# Patient Record
Sex: Female | Born: 1980 | Race: White | Hispanic: No | Marital: Married | Smoking: Never smoker
Health system: Southern US, Community
[De-identification: ages and names within clinical notes are randomized; demographics above are authoritative.]

## PROBLEM LIST (undated history)

## (undated) DIAGNOSIS — IMO0002 Reserved for concepts with insufficient information to code with codable children: Secondary | ICD-10-CM

## (undated) DIAGNOSIS — F32A Depression, unspecified: Secondary | ICD-10-CM

## (undated) DIAGNOSIS — K219 Gastro-esophageal reflux disease without esophagitis: Secondary | ICD-10-CM

## (undated) DIAGNOSIS — R87619 Unspecified abnormal cytological findings in specimens from cervix uteri: Secondary | ICD-10-CM

## (undated) DIAGNOSIS — R87629 Unspecified abnormal cytological findings in specimens from vagina: Secondary | ICD-10-CM

## (undated) DIAGNOSIS — F419 Anxiety disorder, unspecified: Secondary | ICD-10-CM

## (undated) DIAGNOSIS — O009 Unspecified ectopic pregnancy without intrauterine pregnancy: Secondary | ICD-10-CM

## (undated) DIAGNOSIS — K589 Irritable bowel syndrome without diarrhea: Secondary | ICD-10-CM

## (undated) DIAGNOSIS — S22080A Wedge compression fracture of T11-T12 vertebra, initial encounter for closed fracture: Secondary | ICD-10-CM

## (undated) DIAGNOSIS — R519 Headache, unspecified: Secondary | ICD-10-CM

## (undated) DIAGNOSIS — F329 Major depressive disorder, single episode, unspecified: Secondary | ICD-10-CM

## (undated) DIAGNOSIS — O020 Blighted ovum and nonhydatidiform mole: Secondary | ICD-10-CM

## (undated) HISTORY — DX: Blighted ovum and nonhydatidiform mole: O02.0

## (undated) HISTORY — DX: Anxiety disorder, unspecified: F41.9

## (undated) HISTORY — PX: INTRAUTERINE DEVICE (IUD) INSERTION: SHX5877

## (undated) HISTORY — PX: DILATION AND CURETTAGE OF UTERUS: SHX78

---
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
## 2002-04-30 ENCOUNTER — Ambulatory Visit (HOSPITAL_COMMUNITY): Admission: RE | Admit: 2002-04-30 | Discharge: 2002-04-30 | Payer: Self-pay | Admitting: Gastroenterology

## 2002-05-17 ENCOUNTER — Encounter: Payer: Self-pay | Admitting: Gastroenterology

## 2002-05-17 ENCOUNTER — Encounter: Admission: RE | Admit: 2002-05-17 | Discharge: 2002-05-17 | Payer: Self-pay | Admitting: Gastroenterology

## 2002-12-30 HISTORY — PX: WISDOM TOOTH EXTRACTION: SHX21

## 2003-11-04 ENCOUNTER — Other Ambulatory Visit: Admission: RE | Admit: 2003-11-04 | Discharge: 2003-11-04 | Payer: Self-pay | Admitting: Obstetrics & Gynecology

## 2003-12-15 ENCOUNTER — Other Ambulatory Visit: Admission: RE | Admit: 2003-12-15 | Discharge: 2003-12-15 | Payer: Self-pay | Admitting: Obstetrics & Gynecology

## 2003-12-31 HISTORY — PX: COLPOSCOPY: SHX161

## 2005-06-22 ENCOUNTER — Emergency Department (HOSPITAL_COMMUNITY): Admission: EM | Admit: 2005-06-22 | Discharge: 2005-06-22 | Payer: Self-pay | Admitting: Emergency Medicine

## 2005-07-11 ENCOUNTER — Emergency Department (HOSPITAL_COMMUNITY): Admission: EM | Admit: 2005-07-11 | Discharge: 2005-07-11 | Payer: Self-pay | Admitting: Emergency Medicine

## 2006-02-01 ENCOUNTER — Emergency Department (HOSPITAL_COMMUNITY): Admission: EM | Admit: 2006-02-01 | Discharge: 2006-02-01 | Payer: Self-pay | Admitting: Emergency Medicine

## 2006-05-23 ENCOUNTER — Emergency Department: Payer: Self-pay | Admitting: Emergency Medicine

## 2007-03-26 ENCOUNTER — Emergency Department (HOSPITAL_COMMUNITY): Admission: EM | Admit: 2007-03-26 | Discharge: 2007-03-26 | Payer: Self-pay | Admitting: Emergency Medicine

## 2007-04-06 ENCOUNTER — Encounter: Admission: RE | Admit: 2007-04-06 | Discharge: 2007-04-06 | Payer: Self-pay | Admitting: Gastroenterology

## 2008-11-16 ENCOUNTER — Emergency Department: Payer: Self-pay | Admitting: Emergency Medicine

## 2009-12-30 DIAGNOSIS — O009 Unspecified ectopic pregnancy without intrauterine pregnancy: Secondary | ICD-10-CM

## 2009-12-30 HISTORY — DX: Unspecified ectopic pregnancy without intrauterine pregnancy: O00.90

## 2010-03-21 ENCOUNTER — Inpatient Hospital Stay (HOSPITAL_COMMUNITY): Admission: AD | Admit: 2010-03-21 | Discharge: 2010-03-21 | Payer: Self-pay | Admitting: Obstetrics and Gynecology

## 2010-12-04 ENCOUNTER — Ambulatory Visit (HOSPITAL_COMMUNITY)
Admission: RE | Admit: 2010-12-04 | Discharge: 2010-12-04 | Payer: Self-pay | Source: Home / Self Care | Admitting: Obstetrics & Gynecology

## 2011-03-25 LAB — HCG, QUANTITATIVE, PREGNANCY: hCG, Beta Chain, Quant, S: 106 m[IU]/mL — ABNORMAL HIGH (ref <5)

## 2011-03-25 LAB — CBC
HCT: 38.9 % (ref 36.0–46.0)
Hemoglobin: 13.4 g/dL (ref 12.0–15.0)
MCHC: 34.3 g/dL (ref 30.0–36.0)
MCV: 92.6 fL (ref 78.0–100.0)
Platelets: 283 10*3/uL (ref 150–400)
RBC: 4.2 MIL/uL (ref 3.87–5.11)
RDW: 12.7 % (ref 11.5–15.5)
WBC: 8.6 10*3/uL (ref 4.0–10.5)

## 2011-03-25 LAB — ABO/RH: ABO/RH(D): O NEG

## 2011-05-17 NOTE — Procedures (Signed)
. [facility]  Patient:    Erica Estes, Erica Estes Visit Number: [id number] MRN: [id number]          Service Type: END Location: ENDO Attending Physician:  Orland Mustard Dictated by:   Llana Aliment. Randa Evens, M.D. Proc. Date: 04/30/02 Admit Date:  04/30/2002 Discharge Date: 04/30/2002   CC:         [employer] student [employer] service   Procedure Report  DATE OF BIRTH:  [birth year]  PROCEDURE PERFORMED:  Esophagogastroduodenscopy with biopsies.  ENDOSCOPIST:  Llana Aliment. Randa Evens, M.D.  MEDICATIONS:  Cetacaine spray, fentanyl 100 mcg, Versed 10 mg IV.  INDICATIONS:  Upper abdominal pain and nausea refractory to Nexium.  DESCRIPTION OF PROCEDURE:  The procedure had been explained to the patient and consent obtained.  With the patient in the left lateral decubitus position, the Olympus video endoscope was inserted and advanced under direct visualization.  The stomach was entered and pylorus identified and passed. The duodenum including the bulb and second portion were unremarkable.  The scope was withdrawn back into the stomach.  The pyloric channel was normal. Antrum and body were normal.  Fundus and cardia were seen well on the retroflex view and were normal.  The scope was withdrawn.  There was a hiatal hernia and the distal and proximal esophagus were normal.  The patient tolerated the procedure quite well, was maintained on low-flow oxygen and pulse oximeter throughout the procedure. ASSESSMENT:  Small hiatal hernia and possible mild esophageal reflux disease. No other abnormalities.  PLAN:  Will try Robinul.  Will set up ultrasound of her gallbladder next week. Office visit three to four weeks. Dictated by:   Llana Aliment. Randa Evens, M.D. Attending Physician:  Orland Mustard DD:  04/30/02 TD:  05/03/02 Job: [id number]

## 2011-05-17 NOTE — Procedures (Signed)
[facility]  Patient:    Erica Estes, Erica Estes                        MRN: [id number] Proc. Date: 04/29/01 Adm. Date:  [id number] Attending:  Orland Mustard CC:         Caleen Essex. Peter Congo, M.D.   Procedure Report  PROCEDURE:  Esophagogastroduodenoscopy.  GASTROENTEROLOGIST:  Llana Aliment. Randa Evens, M.D.  MEDICATIONS:  Hurricane spray, fentanyl 50 mcg, Versed 7 mg IV.  INDICATIONS:  Persistent abdominal pain despite negative gallbladder ultrasound and treatment with Protonix.  DESCRIPTION OF PROCEDURE:  The procedure had been explained to the patient and consent obtained.  With the patient in the left lateral decubitus position, the Olympus video endoscope was inserted blindly into the esophagus and advanced under direct visualization.  The stomach was entered, pylorus identified and passed.  The duodenum including the bulb and second portion were seen and were unremarkable.  The scope was drawn back in the stomach. The antrum and body were seen well and were normal.  The fundus and cardia were seen well and were normal.  The GE junction was widely patent, and there appeared to be free reflux and marked redness of the esophagus.  The proximal esophagus was normal.  The scope was withdrawn.   The patient tolerated the procedure well.  ASSESSMENT:  Probable esophageal reflux.  PLAN:  Will give her double dose Protonix, reflux instructions, see back in the office in three weeks.   We may need to consider 24-hour pH probe if not improved. DD:  04/29/01 TD:  04/30/01 Job: [id number]

## 2011-05-17 NOTE — Op Note (Signed)
[facility]  Patient:    Erica Estes                       MRN: [id number] Proc. Date: 12/03/99 Adm. Date:  [id number] Attending:  Genia Del                           Operative Report  PREOPERATIVE DIAGNOSIS:       Right persistent pelvic pain.  POSTOPERATIVE DIAGNOSIS:      Right persistent pelvic pain.  Plus sigmoid adhesions and possible minimal endometriosis.  OPERATION:                    Diagnostic laparoscopy with lysis of adhesions and biopsy of the posterior cul-de-sac peritoneum.  SURGEON:                      Genia Del, M.D.  ASSISTANT:  ANESTHESIA:                   Vita Erm, Montez Hageman., M.D.  ESTIMATED BLOOD LOSS:  DESCRIPTION OF PROCEDURE:     Under general anesthesia with endotracheal intubation, the patient is in the lithotomy position.  She is prepped with Betadine on the abdominal, suprapubic, vulvar, and vaginal areas.  The bladder is emptied with a catheter and the patient is draped as usual.  The pelvic examination under general anesthesia reveals an anterior uterus, mobile.  The adnexa are normal. The speculum is inserted.  The uterus is cannulated with a clamp.  The cervix is grasped with the same clamp.  The speculum is removed.  Abdominally an incision is done under the umbilicus over 10 mm with the scalpel.  The Veress needle is inserted while raising the abdominal wall.  The security tests are done and optimal peritoneum is created with 3 liters of CO2.  When done, the Veress needle was removed.  The trocar was inserted and then the camera is introduced.  The exploration of the pelvic cavity reveals a small anteverted uterus, very mobile. The two ovaries are completely normal and mobile.  The tubes are showing normal  pavillions and normal fimbriae and are mobile, but they are elongated, bringing the ovary in the distal part of the tube as abdominal organs.  The  anterior cul-de-sac is completely free of disease.  The posterior cul-de-sac reveals very superficial noncharacteristic lesions that could be endometriosis.  This is mainly on the left uterosacral ligament and between the two uterosacral ligaments.  The ureters are visualized on each side and are in normal anatomy position.  On the left side, there is 10 adhesions between the sigmoid and the parietoperitoneum.  There is ne small adhesion also in the ovarian fossa between the peritoneum and the sigmoid.  A contraincision is done in the suprapubic area with the scalpel at 5 mm.  The 5 m trocar is inserted and scissors are introduced.  A 5 mm incision is also done on the left iliac fossa and the 5 mm trocar is inserted with a bipolar clamp.  The  adhesion of the sigmoid are relieved as much as possible to free the left tube nd ovary on that side and to relieve the tension on the sigmoid.  Then a biopsy is  taken of the superficial possible endometriosis in the posterior cul-de-sac and it is sent to pathology.  Hemostasis is good.  Exploration of  the abdominal cavity is done revealing a normal liver, normal gallbladder, and normal appendix.  No adhesion is present in this area.  The estimated blood loss was minimal. The instruments are removed under direct vision.  The CO2 is evacuated.  The instrument is removed at the level of the vagina as well and the skin is closed with Monocryl 4-0 at the three incisions.  The aponeurosis is not closed at the level of the umbilicus because the incision is too small.  Marcaine 0.25% is infiltrated in he subcutaneous tissue around all incisions.  No complications occurred and the patient was brought to the recovery room in good status. DD:  12/03/99 TD:  12/03/99 Job: [id number]

## 2011-12-31 NOTE — L&D Delivery Note (Signed)
Delivery Note  First Stage: Labor onset: 0100 (12/30) Augmentation : pitocin Analgesia Eliezer Lofts intrapartum: epidural ROM -spontaneous at 0100 (12/30)  Second Stage: Complete dilation at 0016 Onset of pushing at 0030 FHR second stage 140 - category 1  Delivery of a viable female at 62 by CNM in ROA position.  Nuchal cord x 1 reduced over head at birth. Cord double clamped after cessation of pulsation, cut by FOB.  Cord blood sample collected.   Third Stage: Placenta delivered shultz intact with 3 VC.  Placenta - 0110. Uterine tone firm/ bleeding small  2nd degree laceration identified.  Anesthesia for repair: epidural and local 1% xylocaine Repair 3-0 vicryl / 4-0 vicryl subcuticular Est. Blood Loss (mL):  Complications: none  Mom to postpartum.  Baby to Mom.  Newborn: Birth Weight: 6-15 Apgar Scores: 9-9 Feeding planned: breast  Marlinda Mike CNM, MSN 12/29/2012, 1:24 AM

## 2012-12-26 ENCOUNTER — Encounter (HOSPITAL_COMMUNITY): Payer: Self-pay | Admitting: *Deleted

## 2012-12-26 ENCOUNTER — Inpatient Hospital Stay (HOSPITAL_COMMUNITY)
Admission: AD | Admit: 2012-12-26 | Discharge: 2012-12-26 | Disposition: A | Discharge status: Home / Self Care | Payer: 59 | Source: Ambulatory Visit | Attending: Obstetrics & Gynecology | Admitting: Obstetrics & Gynecology

## 2012-12-26 DIAGNOSIS — O99891 Other specified diseases and conditions complicating pregnancy: Secondary | ICD-10-CM | POA: Insufficient documentation

## 2012-12-26 HISTORY — DX: Irritable bowel syndrome, unspecified: K58.9

## 2012-12-26 HISTORY — DX: Gastro-esophageal reflux disease without esophagitis: K21.9

## 2012-12-26 HISTORY — DX: Unspecified abnormal cytological findings in specimens from cervix uteri: R87.619

## 2012-12-26 HISTORY — DX: Wedge compression fracture of t11-T12 vertebra, initial encounter for closed fracture: S22.080A

## 2012-12-26 HISTORY — DX: Reserved for concepts with insufficient information to code with codable children: IMO0002

## 2012-12-26 HISTORY — DX: Depression, unspecified: F32.A

## 2012-12-26 HISTORY — DX: Unspecified ectopic pregnancy without intrauterine pregnancy: O00.90

## 2012-12-26 HISTORY — DX: Major depressive disorder, single episode, unspecified: F32.9

## 2012-12-26 LAB — AMNISURE RUPTURE OF MEMBRANE (ROM) NOT AT ARMC: Amnisure ROM: NEGATIVE

## 2012-12-26 NOTE — MAU Note (Signed)
"  I noticed leaking about 1100 yesterday.   I have been wearing a pantiliner and it was completely soaked yesterday evening.  This morning it was still leaking at that same rate and it made me nervous, so I called the nurse line.  She told me to come in.  I did notice a little spotting yesterday x1.  Then I noticed it again when I went to the BR here that I saw another spot of blood.  (+) FM."

## 2012-12-26 NOTE — MAU Provider Note (Signed)
  History   Patient has presented with history of leakage of fluid for past 24 hrs. Was not completely sure at first and wore a pad over night that was soaked. Called via the Call Service and advised to come to MAU for evaluation of possible ROM.GA. [redacted]w[redacted]d G2P0010. Uncomplicated pregnancy. GBS: neg.  CSN: [id number]  Arrival date and time: 12/26/12 1035   None     Chief Complaint  Patient presents with  . Rupture of Membranes   HPI  G2P0010. Ectopic Pregnancy 2011  History reviewed. No pertinent past medical history.  History reviewed. No pertinent past surgical history.  No family history on file.  History  Substance Use Topics  . Smoking status: Not on file  . Smokeless tobacco: Not on file  . Alcohol Use: Not on file    Allergies: Allergies not on file  No prescriptions prior to admission    Review of Systems  Constitutional: Negative.   HENT: Negative.   Eyes: Negative.   Respiratory: Negative.   Cardiovascular: Negative.   Gastrointestinal: Negative.   Genitourinary: Negative.        R/o possible rupture of membranes  Musculoskeletal: Negative.   Skin: Negative.   Neurological: Negative.   Psychiatric/Behavioral: Negative.    Physical Exam   Blood pressure 139/83, pulse 98, temperature 98 F (36.7 C), temperature source Oral, resp. rate 18, height 5\' 7"  (1.702 m), weight 82.283 kg (181 lb 6.4 oz).  Physical Exam  Constitutional: She is oriented to person, place, and time. She appears well-developed and well-nourished.  HENT:  Head: Normocephalic.  Eyes: Conjunctivae normal and EOM are normal. Pupils are equal, round, and reactive to light.  Neck: Normal range of motion. Neck supple.  Cardiovascular: Normal rate, regular rhythm and normal heart sounds.   Respiratory: Effort normal and breath sounds normal.  GI: Soft. Bowel sounds are normal.  Genitourinary: Vagina normal and uterus normal.       Possible SROM - for evaluation   Musculoskeletal:  Normal range of motion.  Neurological: She is alert and oriented to person, place, and time. She has normal reflexes.  Skin: Skin is warm and dry.  Psychiatric: She has a normal mood and affect.    MAU Course  Procedures  MDM Patient had Amnio- sure. Speculum Examination: Vault visualized - mucus but evidence of Pooling.  SVE: 1/80%/-2, posterior, soft. EFM: FHT's: 150 bpm baseline - Cat 1 tracing. No contractions  Assessment and Plan  Patient presented for evaluation of possible SROM at [redacted]w[redacted]d, G2P0010. Amni-sure: Negative D/c to home. F/U at office this weeks as scheduled.  Rolly Magri, CNM. 12/26/2012, 11:15 AM

## 2012-12-26 NOTE — MAU Note (Signed)
Pt reports having increased wetness since yesterday. Having mild contractions off and on since yesterday as well. Good fetal movement reported.

## 2012-12-28 ENCOUNTER — Inpatient Hospital Stay (HOSPITAL_COMMUNITY): Payer: 59 | Admitting: Anesthesiology

## 2012-12-28 ENCOUNTER — Encounter (HOSPITAL_COMMUNITY): Payer: Self-pay | Admitting: Anesthesiology

## 2012-12-28 ENCOUNTER — Encounter (HOSPITAL_COMMUNITY): Payer: Self-pay | Admitting: *Deleted

## 2012-12-28 ENCOUNTER — Inpatient Hospital Stay (HOSPITAL_COMMUNITY)
Admission: AD | Admit: 2012-12-28 | Discharge: 2012-12-31 | DRG: 775 | Disposition: A | Discharge status: Home / Self Care | Payer: 59 | Source: Ambulatory Visit | Attending: Obstetrics | Admitting: Obstetrics

## 2012-12-28 LAB — OB RESULTS CONSOLE RUBELLA ANTIBODY, IGM: Rubella: UNDETERMINED

## 2012-12-28 LAB — OB RESULTS CONSOLE GBS: GBS: NEGATIVE

## 2012-12-28 LAB — OB RESULTS CONSOLE RPR
RPR: NONREACTIVE
RPR: NONREACTIVE
RPR: NONREACTIVE

## 2012-12-28 LAB — CBC
HCT: 37.8 % (ref 36.0–46.0)
HCT: 36.3 % (ref 36.0–46.0)
Hemoglobin: 12.8 g/dL (ref 12.0–15.0)
Hemoglobin: 12.4 g/dL (ref 12.0–15.0)
MCH: 30.2 pg (ref 26.0–34.0)
MCH: 30.2 pg (ref 26.0–34.0)
MCHC: 33.9 g/dL (ref 30.0–36.0)
MCHC: 34.2 g/dL (ref 30.0–36.0)
MCV: 89.2 fL (ref 78.0–100.0)
MCV: 88.5 fL (ref 78.0–100.0)
Platelets: 201 10*3/uL (ref 150–400)
Platelets: 176 10*3/uL (ref 150–400)
RBC: 4.24 MIL/uL (ref 3.87–5.11)
RBC: 4.1 MIL/uL (ref 3.87–5.11)
RDW: 12.8 % (ref 11.5–15.5)
RDW: 12.6 % (ref 11.5–15.5)
WBC: 14.2 10*3/uL — ABNORMAL HIGH (ref 4.0–10.5)
WBC: 19.4 10*3/uL — ABNORMAL HIGH (ref 4.0–10.5)

## 2012-12-28 LAB — RPR: RPR Ser Ql: NONREACTIVE

## 2012-12-28 LAB — OB RESULTS CONSOLE GC/CHLAMYDIA
Chlamydia: NEGATIVE
Gonorrhea: NEGATIVE

## 2012-12-28 LAB — OB RESULTS CONSOLE ANTIBODY SCREEN: Antibody Screen: NEGATIVE

## 2012-12-28 LAB — OB RESULTS CONSOLE HEPATITIS B SURFACE ANTIGEN: Hepatitis B Surface Ag: NEGATIVE

## 2012-12-28 LAB — OB RESULTS CONSOLE HIV ANTIBODY (ROUTINE TESTING): HIV: NONREACTIVE

## 2012-12-28 MED ORDER — OXYTOCIN 40 UNITS IN LACTATED RINGERS INFUSION - SIMPLE MED: 62.5000 mL/h | INTRAVENOUS | Status: DC

## 2012-12-28 MED ORDER — PROMETHAZINE HCL 25 MG/ML IJ SOLN
12.5000 mg | Freq: Four times a day (QID) | INTRAMUSCULAR | Status: DC | PRN
  Administered 2012-12-28: 12.5 mg via INTRAVENOUS
  Filled 2012-12-28: qty 1

## 2012-12-28 MED ORDER — IBUPROFEN 600 MG PO TABS
600.0000 mg | ORAL_TABLET | Freq: Four times a day (QID) | ORAL | Status: DC | PRN
  Administered 2012-12-29: 600 mg via ORAL
  Filled 2012-12-28: qty 1

## 2012-12-28 MED ORDER — CITRIC ACID-SODIUM CITRATE 334-500 MG/5ML PO SOLN
30.0000 mL | ORAL | Status: DC | PRN
  Administered 2012-12-28: 30 mL via ORAL
  Filled 2012-12-28: qty 15

## 2012-12-28 MED ORDER — PHENYLEPHRINE 40 MCG/ML (10ML) SYRINGE FOR IV PUSH (FOR BLOOD PRESSURE SUPPORT)
80.0000 ug | PREFILLED_SYRINGE | INTRAVENOUS | Status: DC | PRN
  Filled 2012-12-28: qty 5

## 2012-12-28 MED ORDER — PHENYLEPHRINE 40 MCG/ML (10ML) SYRINGE FOR IV PUSH (FOR BLOOD PRESSURE SUPPORT): 80.0000 ug | PREFILLED_SYRINGE | INTRAVENOUS | Status: DC | PRN

## 2012-12-28 MED ORDER — ACETAMINOPHEN 325 MG PO TABS: 650.0000 mg | ORAL_TABLET | ORAL | Status: DC | PRN

## 2012-12-28 MED ORDER — OXYTOCIN BOLUS FROM INFUSION
500.0000 mL | INTRAVENOUS | Status: DC
  Administered 2012-12-29: 500 mL via INTRAVENOUS

## 2012-12-28 MED ORDER — ONDANSETRON HCL 4 MG/2ML IJ SOLN
4.0000 mg | Freq: Four times a day (QID) | INTRAMUSCULAR | Status: DC | PRN
  Administered 2012-12-28: 4 mg via INTRAVENOUS
  Filled 2012-12-28: qty 2

## 2012-12-28 MED ORDER — OXYTOCIN 40 UNITS IN LACTATED RINGERS INFUSION - SIMPLE MED
1.0000 m[IU]/min | INTRAVENOUS | Status: DC
  Administered 2012-12-28: 2 m[IU]/min via INTRAVENOUS
  Filled 2012-12-28: qty 1000

## 2012-12-28 MED ORDER — LIDOCAINE HCL (PF) 1 % IJ SOLN
INTRAMUSCULAR | Status: DC | PRN
  Administered 2012-12-28 (×2): 5 mL
  Administered 2012-12-29: 30 mL

## 2012-12-28 MED ORDER — LACTATED RINGERS IV SOLN
INTRAVENOUS | Status: DC
  Administered 2012-12-28: 19:00:00 via INTRAVENOUS

## 2012-12-28 MED ORDER — NALBUPHINE SYRINGE 5 MG/0.5 ML
10.0000 mg | INJECTION | INTRAMUSCULAR | Status: DC | PRN
  Administered 2012-12-28: 10 mg via INTRAVENOUS
  Filled 2012-12-28 (×2): qty 1

## 2012-12-28 MED ORDER — LACTATED RINGERS IV SOLN: 500.0000 mL | INTRAVENOUS | Status: DC | PRN

## 2012-12-28 MED ORDER — EPHEDRINE 5 MG/ML INJ
10.0000 mg | INTRAVENOUS | Status: DC | PRN
  Administered 2012-12-29: 10 mg via INTRAVENOUS
  Filled 2012-12-28: qty 4

## 2012-12-28 MED ORDER — LACTATED RINGERS IV BOLUS (SEPSIS)
500.0000 mL | Freq: Once | INTRAVENOUS | Status: AC
  Administered 2012-12-28: 500 mL via INTRAVENOUS

## 2012-12-28 MED ORDER — LACTATED RINGERS IV SOLN
500.0000 mL | Freq: Once | INTRAVENOUS | Status: AC
  Administered 2012-12-28: 500 mL via INTRAVENOUS

## 2012-12-28 MED ORDER — EPHEDRINE 5 MG/ML INJ
10.0000 mg | INTRAVENOUS | Status: AC | PRN
  Administered 2012-12-28 (×2): 10 mg via INTRAVENOUS
  Filled 2012-12-28: qty 4

## 2012-12-28 MED ORDER — OXYCODONE-ACETAMINOPHEN 5-325 MG PO TABS: 1.0000 | ORAL_TABLET | ORAL | Status: DC | PRN

## 2012-12-28 MED ORDER — DIPHENHYDRAMINE HCL 50 MG/ML IJ SOLN: 12.5000 mg | INTRAMUSCULAR | Status: DC | PRN

## 2012-12-28 MED ORDER — LIDOCAINE HCL (PF) 1 % IJ SOLN
30.0000 mL | INTRAMUSCULAR | Status: DC | PRN
  Filled 2012-12-28: qty 30

## 2012-12-28 MED ORDER — FENTANYL 2.5 MCG/ML BUPIVACAINE 1/10 % EPIDURAL INFUSION (WH - ANES)
14.0000 mL/h | INTRAMUSCULAR | Status: DC
  Administered 2012-12-28: 14 mL/h via EPIDURAL
  Filled 2012-12-28: qty 125

## 2012-12-28 NOTE — Progress Notes (Signed)
S:  Moaning and crying with ctx - wants epidural  O:  VS: Blood pressure 142/88, pulse 74, temperature 98.4 F (36.9 C), temperature source Oral, resp. rate 20, height 5\' 8"  (1.727 m), weight 82.101 kg (181 lb).        FHR : baseline 145 / variability moderate / accels + / decels none        Toco: contractions every 2-3 minutes / pitocin 12 mu/min         Cervix : 5-6 / 90% / vtx / -1        Membranes: +show  A: active labor     FHR category 1  P: place epidural    Marlinda Mike CNM, MSN 12/28/2012, 11:03 PM

## 2012-12-28 NOTE — Progress Notes (Signed)
S:  Ctx stronger  O:  VS: Blood pressure 132/87, pulse 85, temperature 98.6 F (37 C), temperature source Oral, resp. rate 20, height 5\' 8"  (1.727 m), weight 82.101 kg (181 lb).        FHR : baseline 145 / variability moderate / accels + / decels none        Toco: contractions every 2-5 minutes / mild / MVU 80-90 - IUPC        Cervix : unchanged        Membranes: clear - small show  A: protracted latent labor     FHR category 1  P: recommend pitocin augmentation - agrees by patient      Water immersion for pain management       Remove IUPC prior to water immersion     Marlinda Mike CNM, MSN 12/28/2012, 6:03 PM

## 2012-12-28 NOTE — H&P (Signed)
Erica Estes is a 31 y.o. female, G2P0010 at [redacted]w[redacted]d , presenting for labor management with confirmed SROM at 01.00 hrs 12/28/12. Hx of Ectopic 2011. GBS neg. Uncomplicated pregnancy. Blood type O neg: Rhogam  In 1st Trimester and at 28 weeks. Taking Wellbutrin for depression at time of admission.  There is no problem list on file for this patient.   History of present pregnancy: Patient entered care at [redacted]w[redacted]d weeks.   EDC of  was established by LMP and confirmed with sono.   Anatomy scan:  [redacted]w[redacted]d weeks, with normal findings and an anterior placenta.   Additional [place] evaluations:  .   Significant prenatal events:  Spotting early pregnancy and Rhogam given in 1st Trimester due to [facility] neg status   Last evaluation: MAU 12/28/12 prior to admission  OB History    Grav Para Term Preterm Abortions TAB SAB Ect Mult Living   2    1   1   0     Past Medical History  Diagnosis Date  . GERD (gastroesophageal reflux disease)   . IBS (irritable bowel syndrome)   . Abnormal Pap smear   . Compression fracture of T12 vertebra 2000  . Ectopic pregnancy 2011    no tx needed, "It absorbed itself"  . Depression     Wellbutrin 150 mg daily   Past Surgical History  Procedure Date  . Laparoscopy 2000  . Tonsillectomy 12/98  . Colposcopy 2005   Family History: family history includes Bipolar disorder in her maternal aunt; Cancer (age of onset:40) in her father; Depression in her maternal aunt; Heart disease in her paternal grandfather; Hyperlipidemia in her maternal aunt and mother; and Hypertension in her maternal aunt, maternal grandfather, and mother. Social History:  does not have a smoking history on file. She does not have any smokeless tobacco history on file. Her alcohol and drug histories not on file.   Prenatal Transfer Tool  Maternal Diabetes: No Genetic Screening: Normal Maternal Ultrasounds/Referrals: Normal Fetal Ultrasounds or other Referrals:  None Maternal Substance Abuse:   No Significant Maternal Medications:  Meds include: Other: Wellbutrin for depression. Significant Maternal Lab Results: None  ROS:  Systems reviewed. Gross Rupture at 01.00 hrs and prodromal labor - all other systems negative/  No Known Allergies   Dilation: 1 Effacement (%): 90 Station: -2 Exam by:: d. Starlette Thurow, cnm Blood pressure 120/79, pulse 97, temperature 98.8 F (37.1 C), temperature source Oral, resp. rate 18, height 5\' 8"  (1.727 m), weight 82.101 kg (181 lb).   Physical Examination: Affect: AAO x 3 Breasts; N/T Skin: intact/ pale Chest: CTAB Heart RRR without murmur Abd gravid, NT, FH to dates Pelvic: Normal Ext: Amniotic fluid visualized at Introitus Speculum: Pooling ++, Ferning Test: positive SVE: 1/90/-2 posterior, Soft, Vx.  FHR: 145 bpm, Cat 1 Tracing. UCs:  Mild Uc's 1: 3 mins. Coping well. Rates pain at 3-4/10  Prenatal labs: ABO, Rh:  O neg Antibody: Negative (12/30 0000) Rubella:   Immune RPR: Nonreactive, Nonreactive, Nonreactive (12/30 0000)  HBsAg: Negative (12/30 0000)  HIV: Non-reactive (12/30 0000)  GBS: Negative (12/30 0000) Sickle cell/Hgb electrophoresis:  neg Pap: Normal GC: neg Chlamydia:  neg Genetic screenings:  Normal Glucola:  Normal GBS: Neg Other:  Rhogam 1st trimester and at 28 weeks O Rh neg status.       Assessment/Plan: IUP at [redacted]w[redacted]d SROM confirmed 01.00hrs 12/28/12. Admit for labor management. Desires Water Birth. Desires minimal intervention. No IV access at present until necessary. Intermittent monitoring.  [name], DENISECNM. 12/28/2012, 6:04 AM

## 2012-12-28 NOTE — MAU Note (Signed)
Pt presents with Srom clear fluid at 0200 and contractions since 0230

## 2012-12-28 NOTE — Anesthesia Preprocedure Evaluation (Signed)
Anesthesia Evaluation  Patient identified by MRN, date of birth, ID band Patient awake    Reviewed: Allergy & Precautions, H&P , Patient's Chart, lab work & pertinent test results  Airway Mallampati: II TM Distance: >3 FB Neck ROM: full    Dental No notable dental hx.    Pulmonary neg pulmonary ROS,  breath sounds clear to auscultation  Pulmonary exam normal       Cardiovascular negative cardio ROS  Rhythm:regular Rate:Normal     Neuro/Psych negative neurological ROS  negative psych ROS   GI/Hepatic negative GI ROS, Neg liver ROS, GERD-  Controlled,  Endo/Other  negative endocrine ROS  Renal/GU negative Renal ROS     Musculoskeletal   Abdominal   Peds  Hematology negative hematology ROS (+)   Anesthesia Other Findings   Reproductive/Obstetrics (+) Pregnancy                           Anesthesia Physical Anesthesia Plan  ASA: II  Anesthesia Plan: Epidural   Post-op Pain Management:    Induction:   Airway Management Planned:   Additional Equipment:   Intra-op Plan:   Post-operative Plan:   Informed Consent: I have reviewed the patients History and Physical, chart, labs and discussed the procedure including the risks, benefits and alternatives for the proposed anesthesia with the patient or authorized representative who has indicated his/her understanding and acceptance.     Plan Discussed with:   Anesthesia Plan Comments:         Anesthesia Quick Evaluation

## 2012-12-28 NOTE — Anesthesia Procedure Notes (Signed)
Epidural Patient location during procedure: OB Start time: 12/28/2012 11:39 PM  Staffing Anesthesiologist: Angus Seller., Harrell Gave. Performed by: anesthesiologist   Preanesthetic Checklist Completed: patient identified, site marked, surgical consent, pre-op evaluation, timeout performed, IV checked, risks and benefits discussed and monitors and equipment checked  Epidural Patient position: sitting Prep: site prepped and draped and DuraPrep Patient monitoring: continuous pulse ox and blood pressure Approach: midline Injection technique: LOR air and LOR saline  Needle:  Needle type: Tuohy  Needle gauge: 17 G Needle length: 9 cm and 9 Needle insertion depth: 5 cm cm Catheter type: closed end flexible Catheter size: 19 Gauge Catheter at skin depth: 10 cm Test dose: negative  Assessment Events: blood not aspirated, injection not painful, no injection resistance, negative IV test and no paresthesia  Additional Notes Patient identified.  Risk benefits discussed including failed block, incomplete pain control, headache, nerve damage, paralysis, blood pressure changes, nausea, vomiting, reactions to medication both toxic or allergic, and postpartum back pain.  Patient expressed understanding and wished to proceed.  All questions were answered.  Sterile technique used throughout procedure and epidural site dressed with sterile barrier dressing. No paresthesia or other complications noted.The patient did not experience any signs of intravascular injection such as tinnitus or metallic taste in mouth nor signs of intrathecal spread such as rapid motor block. Please see nursing notes for vital signs.

## 2012-12-28 NOTE — Progress Notes (Signed)
S:  "Cant do this anymore... Just too tired and it hurts too bad"       Wants to try IV analgesia  O:  VS: Blood pressure 111/60, pulse 88, temperature 98.5 F (36.9 C), temperature source Oral, resp. rate 20, height 5\' 8"  (1.727 m), weight 82.101 kg (181 lb).        FHR : baseline 140 / variability moderate / accels + / decels none        Toco: contractions every 2-3 minutes / pitocin at 49mu/min / IUPC out        Cervix : unchanged        Membranes: clear  A: protracted labor - adequate ctx pattern in past 1/2 hour      Maternal fatigue      FHR category 1  P: Recommend IV antiemetic and IV analgesia      Rest x 1-2 hours      IV fluid bolus x 500 ml      Reattempt water immersion after short therapeutic rest      Consider epidural if IV analgesia / water not effective   Marlinda Mike CNM, MSN 12/28/2012, 9:40 PM

## 2012-12-28 NOTE — Progress Notes (Signed)
Patient has been walking and coping well with contractions. O VSS Filed Vitals:   12/28/12 0805  BP: 130/83  Pulse: 76  Temp: 98.5 F (36.9 C)  Resp: 18         fhts category 1 basline 135 bpm      abd soft between       Contractions  1: 2 mins      Vag: 3/90%/-2 , Vx , clear fluid draining and remains afebrile.  A: Patient SROM at 01.00hrs, clear,  P Continue care, Ambulating at present and rocking at the side of bed. Tub not inflated as yet due to prodromal status.    Desires minimal intervention. Wants to get labor pattern established spontaneously.  Earl Gala, CNM.

## 2012-12-28 NOTE — Progress Notes (Signed)
S:  Ctx stronger states more like "5"  O:  VS: Blood pressure 113/79, pulse 82, temperature 97.9 F (36.6 C), temperature source Oral, resp. rate 18, height 5\' 8"  (1.727 m), weight 82.101 kg (181 lb).        FHR cat  1        Toco: contractions every 2-3 minutes / mild        Appears same - not breathing with ctx / does not appear uncomfortable        Cervix : 3 / 90 / vtx / -2/ ROP        Membranes: clear fluid leak  A: latent labor -protracted with OP presentation     FHR category 1     Desires no intervention at this time  P: expectant management - recheck in 2-4 hours      Consider augmentation if no progression to active labor by next check       Recommend ambulation and water immersion in next 2 hours - avoid resting in bed     Marlinda Mike CNM, MSN 12/28/2012, 1:20 PM

## 2012-12-29 ENCOUNTER — Encounter (HOSPITAL_COMMUNITY): Payer: Self-pay | Admitting: Certified Nurse Midwife

## 2012-12-29 MED ORDER — WITCH HAZEL-GLYCERIN EX PADS: 1.0000 "application " | MEDICATED_PAD | CUTANEOUS | Status: DC | PRN

## 2012-12-29 MED ORDER — DOCUSATE SODIUM 100 MG PO CAPS
100.0000 mg | ORAL_CAPSULE | Freq: Every day | ORAL | Status: DC
  Administered 2012-12-29 – 2012-12-31 (×3): 100 mg via ORAL
  Filled 2012-12-29 (×3): qty 1

## 2012-12-29 MED ORDER — IBUPROFEN 600 MG PO TABS
600.0000 mg | ORAL_TABLET | Freq: Four times a day (QID) | ORAL | Status: DC
  Administered 2012-12-29 – 2012-12-31 (×10): 600 mg via ORAL
  Filled 2012-12-29 (×10): qty 1

## 2012-12-29 MED ORDER — BENZOCAINE-MENTHOL 20-0.5 % EX AERO
1.0000 "application " | INHALATION_SPRAY | CUTANEOUS | Status: DC | PRN
  Administered 2012-12-29: 1 via TOPICAL
  Filled 2012-12-29: qty 56

## 2012-12-29 MED ORDER — DIPHENHYDRAMINE HCL 25 MG PO CAPS: 25.0000 mg | ORAL_CAPSULE | Freq: Four times a day (QID) | ORAL | Status: DC | PRN

## 2012-12-29 MED ORDER — PRENATAL MULTIVITAMIN CH
1.0000 | ORAL_TABLET | Freq: Every day | ORAL | Status: DC
  Administered 2012-12-29 – 2012-12-31 (×3): 1 via ORAL
  Filled 2012-12-29 (×3): qty 1

## 2012-12-29 MED ORDER — SENNOSIDES-DOCUSATE SODIUM 8.6-50 MG PO TABS
2.0000 | ORAL_TABLET | Freq: Every day | ORAL | Status: DC
  Administered 2012-12-29 – 2012-12-30 (×2): 2 via ORAL

## 2012-12-29 MED ORDER — ONDANSETRON HCL 4 MG PO TABS: 4.0000 mg | ORAL_TABLET | ORAL | Status: DC | PRN

## 2012-12-29 MED ORDER — LANOLIN HYDROUS EX OINT: TOPICAL_OINTMENT | CUTANEOUS | Status: DC | PRN

## 2012-12-29 MED ORDER — DIBUCAINE 1 % RE OINT: 1.0000 "application " | TOPICAL_OINTMENT | RECTAL | Status: DC | PRN

## 2012-12-29 MED ORDER — ONDANSETRON HCL 4 MG/2ML IJ SOLN: 4.0000 mg | INTRAMUSCULAR | Status: DC | PRN

## 2012-12-29 MED ORDER — ACETAMINOPHEN 325 MG PO TABS: 650.0000 mg | ORAL_TABLET | ORAL | Status: DC | PRN

## 2012-12-29 MED ORDER — OXYCODONE-ACETAMINOPHEN 5-325 MG PO TABS: 1.0000 | ORAL_TABLET | ORAL | Status: DC | PRN

## 2012-12-29 MED ORDER — MAGNESIUM HYDROXIDE 400 MG/5ML PO SUSP
30.0000 mL | ORAL | Status: DC | PRN
  Administered 2012-12-30: 30 mL via ORAL
  Filled 2012-12-29: qty 30

## 2012-12-29 MED ORDER — SIMETHICONE 80 MG PO CHEW: 80.0000 mg | CHEWABLE_TABLET | ORAL | Status: DC | PRN

## 2012-12-29 MED ORDER — BUPROPION HCL ER (XL) 150 MG PO TB24
150.0000 mg | ORAL_TABLET | Freq: Every day | ORAL | Status: DC
  Administered 2012-12-29 – 2012-12-31 (×3): 150 mg via ORAL
  Filled 2012-12-29 (×4): qty 1

## 2012-12-29 MED ORDER — FAMOTIDINE 20 MG PO TABS
20.0000 mg | ORAL_TABLET | Freq: Every day | ORAL | Status: DC
Start: 2012-12-29 — End: 2012-12-31
  Administered 2012-12-29 – 2012-12-31 (×3): 20 mg via ORAL
  Filled 2012-12-29 (×3): qty 1

## 2012-12-29 NOTE — Progress Notes (Signed)
Post Partum Day 1 NSVD without complications viable female infant.  Subjective: no complaints, up ad lib without syncope, voiding, tolerating PO, + flatus, +BM  Pain well controlled with po meds, taking motrin and percocet  BF: al demand Mood stable, bonding well.   Objective: Blood pressure 108/68, pulse 89, temperature 98.2 F (36.8 C), temperature source Oral, resp. rate 18, height 5\' 8"  (1.727 m), weight 82.101 kg (181 lb), SpO2 98.00%, unknown if currently breastfeeding.  Physical Exam:  General: alert, cooperative, appears stated age and no distress Lungs: CTAB Heart: RRR Lochia: appropriate Uterine Fundus: firm, -2/u Perineum: 2nd laceration.Healing well. DVT Evaluation: No evidence of DVT seen on physical exam. Negative Homan's sign. No cords or calf tenderness. No significant calf/ankle edema.   Basename 12/28/12 2255 12/28/12 0607  HGB 12.4 12.8  HCT 36.3 37.8    Assessment/Plan: Plan for discharge tomorrow Plans hospital circumcision desired.  Earl Gala, CNM.

## 2012-12-29 NOTE — Progress Notes (Signed)
S:  Resting left lateral since epidural  O:  VS: Blood pressure 107/38, pulse 85, temperature 97.5 F (36.4 C), temperature source Oral, resp. rate 20, height 5\' 8"  (1.727 m), weight 82.101 kg (181 lb), SpO2 97.00%.        FHR : baseline 145 / variability moderate / accels +  / decels mild variables        Toco: contractions every 2-4 minutes / moderate / pitocin at 14 mu/min        Cervix : 10 / 100% / vtx +2        Membranes: clear fluid / + show        Foley placed - draining clear yellow urine  A: active labor     FHR category 1  P: anticipate SVB -passive descent until urge to push     Marlinda Mike CNM, MSN 12/29/2012, 12:21 AM

## 2012-12-29 NOTE — Progress Notes (Signed)

## 2012-12-29 NOTE — Anesthesia Postprocedure Evaluation (Signed)
  Anesthesia Post-op Note  Patient: Erica Estes  Procedure(s) Performed: * No procedures listed *  Patient Location: Mother/Baby  Anesthesia Type:Epidural  Level of Consciousness: awake, alert  and oriented  Airway and Oxygen Therapy: Patient Spontanous Breathing  Post-op Pain: none  Post-op Assessment: Post-op Vital signs reviewed  Post-op Vital Signs: Reviewed and stable  Complications: No apparent anesthesia complications

## 2012-12-30 ENCOUNTER — Encounter (HOSPITAL_COMMUNITY): Payer: Self-pay | Admitting: *Deleted

## 2012-12-30 LAB — CBC
HCT: 36.2 % (ref 36.0–46.0)
Hemoglobin: 11.9 g/dL — ABNORMAL LOW (ref 12.0–15.0)
MCH: 30.1 pg (ref 26.0–34.0)
MCHC: 32.9 g/dL (ref 30.0–36.0)
MCV: 91.4 fL (ref 78.0–100.0)
Platelets: 179 10*3/uL (ref 150–400)
RBC: 3.96 MIL/uL (ref 3.87–5.11)
RDW: 13.1 % (ref 11.5–15.5)
WBC: 15.9 10*3/uL — ABNORMAL HIGH (ref 4.0–10.5)

## 2012-12-30 MED ORDER — MEASLES, MUMPS & RUBELLA VAC ~~LOC~~ INJ
0.5000 mL | INJECTION | Freq: Once | SUBCUTANEOUS | Status: AC
  Administered 2012-12-31: 0.5 mL via SUBCUTANEOUS
  Filled 2012-12-30: qty 0.5

## 2012-12-30 NOTE — Progress Notes (Addendum)
Post Partum Day 2 NSVD without complications viable female infant. Subjective: No complaints, up ad lib without syncope, voiding, tolerating PO, + flatus, +BM  Pain well controlled with po meds, taking motrin and percocet  BF: on demand and pumping - Baby is not latching well and will not spontaneously suck. Lactation working with this patient and patient is now pumping and giving colostrum. Mood stable, bonding well.    Objective: Blood pressure 113/72, pulse 80, temperature 97.9 F (36.6 C), temperature source Oral, resp. rate 20, height 5\' 8"  (1.727 m), weight 82.101 kg (181 lb), SpO2 98.00%, unknown if currently breastfeeding.  Physical Exam:  General: alert, cooperative and no distress Lungs: CTAB Heart: RRR Lochia: appropriate Uterine Fundus: firm Perineum: DVT Evaluation: No evidence of DVT seen on physical exam. Negative Homan's sign. No cords or calf tenderness. No significant calf/ankle edema.   Basename 12/30/12 0552 12/28/12 2255  HGB 11.9* 12.4  HCT 36.2 36.3    Assessment/Plan: Plan for discharge tomorrow Plans hospital circumcision tomorrow as recommended by peds as baby is not established feeding as yet. To remain  One extra night to assist baby with feeding - instruction from Peds. Discharge tomorrow and will get on call MD to perform Circumcision.      LOS: 2 days   Shahram Alexopoulos, CNM. 12/30/2012, 9:45 AM

## 2012-12-31 MED ORDER — IBUPROFEN 600 MG PO TABS: 600.0000 mg | ORAL_TABLET | Freq: Four times a day (QID) | ORAL | Status: DC

## 2012-12-31 NOTE — Discharge Summary (Signed)
Obstetric Discharge Summary  Reason for Admission: onset of labor Prenatal Procedures: none Intrapartum Procedures: spontaneous vaginal delivery Postpartum Procedures: none Complications-Operative and Postpartum: 2nd degree perineal laceration Hemoglobin  Date Value Range Status  12/30/2012 11.9* 12.0 - 15.0 g/dL Final     HCT  Date Value Range Status  12/30/2012 36.2  36.0 - 46.0 % Final    Physical Exam:  General: alert, cooperative and no distress Lochia: appropriate Uterine Fundus: firm Incision: healing well DVT Evaluation: No evidence of DVT seen on physical exam.  Discharge Diagnoses: Term Pregnancy-delivered  Discharge Information: Date: 12/31/2012 Activity: pelvic rest Diet: routine Medications: PNV and Ibuprofen Condition: stable Instructions: refer to practice specific booklet Discharge to: home Follow-up Information    Follow up with Marlinda Mike, CNM.   Contact information:   [address] [place] [address] [phone]          Newborn Data: Live born female  Birth Weight: 6 lb 15 oz (3147 g) APGAR: 9, 9  Home with mother.  Marlinda Mike 12/31/2012, 11:54 AM

## 2012-12-31 NOTE — Progress Notes (Signed)
PPD 2 SVD  S:  Reports feeling well             Tolerating po/ No nausea or vomiting             Bleeding is light             Pain controlled with motrin             Up ad lib / ambulatory  Newborn breast feeding  / Circumcision planned   O:               VS: BP 109/75  Pulse 69  Temp 98.1 F (36.7 C) (Oral)  Resp 18  Ht 5\' 8"  (1.727 m)  Wt 82.101 kg (181 lb)  BMI 27.52 kg/m2  SpO2 98%  Breastfeeding? Unknown   LABS:  Basename 12/30/12 0552 12/28/12 2255  WBC 15.9* 19.4*  HGB 11.9* 12.4  PLT 179 176                                          I&O:                                         Physical Exam:             Alert and oriented X3  Lungs: Clear and unlabored  Heart: regular rate and rhythm / no mumurs  Abdomen: soft, non-tender, non-distended              Fundus: firm, non-tender, U-1  Lochia: light  Extremities: no edema, no calf pain or tenderness    A: PPD # 2   Doing well - stable status  P:  Routine post partum orders    Marlinda Mike CNM, MSN 12/31/2012, 11:50 AM

## 2013-09-17 ENCOUNTER — Emergency Department (HOSPITAL_COMMUNITY)
Admission: EM | Admit: 2013-09-17 | Discharge: 2013-09-17 | Disposition: A | Discharge status: Home / Self Care | Payer: 59 | Attending: Emergency Medicine | Admitting: Emergency Medicine

## 2013-09-17 ENCOUNTER — Encounter (HOSPITAL_COMMUNITY): Payer: Self-pay | Admitting: Emergency Medicine

## 2013-09-17 ENCOUNTER — Emergency Department (HOSPITAL_COMMUNITY)
Admission: EM | Admit: 2013-09-17 | Discharge: 2013-09-17 | Disposition: A | Discharge status: Home / Self Care | Payer: 59 | Source: Home / Self Care | Attending: Family Medicine | Admitting: Family Medicine

## 2013-09-17 DIAGNOSIS — Z8742 Personal history of other diseases of the female genital tract: Secondary | ICD-10-CM | POA: Insufficient documentation

## 2013-09-17 DIAGNOSIS — Z8739 Personal history of other diseases of the musculoskeletal system and connective tissue: Secondary | ICD-10-CM | POA: Insufficient documentation

## 2013-09-17 DIAGNOSIS — K529 Noninfective gastroenteritis and colitis, unspecified: Secondary | ICD-10-CM

## 2013-09-17 DIAGNOSIS — Z8719 Personal history of other diseases of the digestive system: Secondary | ICD-10-CM | POA: Insufficient documentation

## 2013-09-17 DIAGNOSIS — R197 Diarrhea, unspecified: Secondary | ICD-10-CM | POA: Insufficient documentation

## 2013-09-17 DIAGNOSIS — Z79899 Other long term (current) drug therapy: Secondary | ICD-10-CM | POA: Insufficient documentation

## 2013-09-17 DIAGNOSIS — F329 Major depressive disorder, single episode, unspecified: Secondary | ICD-10-CM | POA: Insufficient documentation

## 2013-09-17 DIAGNOSIS — F3289 Other specified depressive episodes: Secondary | ICD-10-CM | POA: Insufficient documentation

## 2013-09-17 DIAGNOSIS — R112 Nausea with vomiting, unspecified: Secondary | ICD-10-CM | POA: Insufficient documentation

## 2013-09-17 DIAGNOSIS — R1013 Epigastric pain: Secondary | ICD-10-CM | POA: Insufficient documentation

## 2013-09-17 DIAGNOSIS — Z3202 Encounter for pregnancy test, result negative: Secondary | ICD-10-CM | POA: Insufficient documentation

## 2013-09-17 LAB — CBC WITH DIFFERENTIAL/PLATELET
Basophils Absolute: 0 10*3/uL (ref 0.0–0.1)
Basophils Relative: 0 % (ref 0–1)
Eosinophils Absolute: 0 10*3/uL (ref 0.0–0.7)
Eosinophils Relative: 0 % (ref 0–5)
HCT: 40.4 % (ref 36.0–46.0)
Hemoglobin: 13.8 g/dL (ref 12.0–15.0)
Lymphocytes Relative: 17 % (ref 12–46)
Lymphs Abs: 1.3 10*3/uL (ref 0.7–4.0)
MCH: 30.1 pg (ref 26.0–34.0)
MCHC: 34.2 g/dL (ref 30.0–36.0)
MCV: 88.2 fL (ref 78.0–100.0)
Monocytes Absolute: 0.4 10*3/uL (ref 0.1–1.0)
Monocytes Relative: 6 % (ref 3–12)
Neutro Abs: 5.8 10*3/uL (ref 1.7–7.7)
Neutrophils Relative %: 77 % (ref 43–77)
Platelets: 209 10*3/uL (ref 150–400)
RBC: 4.58 MIL/uL (ref 3.87–5.11)
RDW: 12.5 % (ref 11.5–15.5)
WBC: 7.5 10*3/uL (ref 4.0–10.5)

## 2013-09-17 LAB — URINALYSIS, ROUTINE W REFLEX MICROSCOPIC
Glucose, UA: NEGATIVE mg/dL
Hgb urine dipstick: NEGATIVE
Ketones, ur: 15 mg/dL — AB
Leukocytes, UA: NEGATIVE
Nitrite: NEGATIVE
Protein, ur: NEGATIVE mg/dL
Specific Gravity, Urine: 1.031 — ABNORMAL HIGH (ref 1.005–1.030)
Urobilinogen, UA: 0.2 mg/dL (ref 0.0–1.0)
pH: 5.5 (ref 5.0–8.0)

## 2013-09-17 LAB — LIPASE, BLOOD: Lipase: 27 U/L (ref 11–59)

## 2013-09-17 LAB — COMPREHENSIVE METABOLIC PANEL
ALT: 13 U/L (ref 0–35)
AST: 16 U/L (ref 0–37)
Albumin: 4 g/dL (ref 3.5–5.2)
Alkaline Phosphatase: 71 U/L (ref 39–117)
BUN: 16 mg/dL (ref 6–23)
CO2: 22 mEq/L (ref 19–32)
Calcium: 9.2 mg/dL (ref 8.4–10.5)
Chloride: 105 mEq/L (ref 96–112)
Creatinine, Ser: 0.74 mg/dL (ref 0.50–1.10)
GFR calc Af Amer: >90 mL/min (ref >90)
GFR calc non Af Amer: >90 mL/min (ref >90)
Glucose, Bld: 110 mg/dL — ABNORMAL HIGH (ref 70–99)
Potassium: 3.6 mEq/L (ref 3.5–5.1)
Sodium: 138 mEq/L (ref 135–145)
Total Bilirubin: 1.1 mg/dL (ref 0.3–1.2)
Total Protein: 6.9 g/dL (ref 6.0–8.3)

## 2013-09-17 LAB — PREGNANCY, URINE: Preg Test, Ur: NEGATIVE

## 2013-09-17 MED ORDER — ONDANSETRON HCL 4 MG/2ML IJ SOLN
INTRAMUSCULAR | Status: AC
  Filled 2013-09-17: qty 2

## 2013-09-17 MED ORDER — SODIUM CHLORIDE 0.9 % IV SOLN
Freq: Once | INTRAVENOUS | Status: AC
  Administered 2013-09-17: 12:00:00 via INTRAVENOUS

## 2013-09-17 MED ORDER — ONDANSETRON HCL 4 MG/2ML IJ SOLN
4.0000 mg | Freq: Once | INTRAMUSCULAR | Status: AC
  Administered 2013-09-17: 4 mg via INTRAVENOUS

## 2013-09-17 MED ORDER — PROMETHAZINE HCL 25 MG PO TABS: 25.0000 mg | ORAL_TABLET | Freq: Four times a day (QID) | ORAL | Status: DC | PRN

## 2013-09-17 MED ORDER — ONDANSETRON HCL 4 MG/2ML IJ SOLN
4.0000 mg | Freq: Once | INTRAMUSCULAR | Status: AC
  Administered 2013-09-17: 4 mg via INTRAVENOUS
  Filled 2013-09-17: qty 2

## 2013-09-17 MED ORDER — ONDANSETRON HCL 4 MG PO TABS: 4.0000 mg | ORAL_TABLET | Freq: Four times a day (QID) | ORAL | Status: DC

## 2013-09-17 MED ORDER — SODIUM CHLORIDE 0.9 % IV BOLUS (SEPSIS)
1000.0000 mL | Freq: Once | INTRAVENOUS | Status: AC
  Administered 2013-09-17: 1000 mL via INTRAVENOUS

## 2013-09-17 NOTE — ED Provider Notes (Signed)
CSN: [id number]     Arrival date & time 09/17/13  2013 History   First MD Initiated Contact with Patient 09/17/13 2053     Chief Complaint  Patient presents with  . Emesis  . Diarrhea   (Consider location/radiation/quality/duration/timing/severity/associated sxs/prior Treatment) HPI Comments: Patient presents with a chief complaint of nausea, vomiting, and diarrhea.  She reports that symptoms began earlier this morning.  She has had several episodes of both diarrhea and vomiting.  She denies blood in her emesis or blood in her stool.  Husband with similar symptoms.  She was seen by Urgent Care earlier today.  She was given a prescription for Zofran.  She reports the Zofran has helped with her vomiting but she continues to feel nauseous.   Last episode of vomiting was at 11 AM approximately nine hours prior to arrival.  She states that she has been able to tolerate a small amount of liquids.  She states that she has mild abdominal pain in the epigastric area that began after several episodes of vomiting.  Pain does not radiate.  Denies urinary symptoms.  Denies fever or chills.  Temperature 100 F in the ED upon arrival.    The history is provided by the patient.    Past Medical History  Diagnosis Date  . GERD (gastroesophageal reflux disease)   . IBS (irritable bowel syndrome)   . Abnormal Pap smear   . Compression fracture of T12 vertebra 2000  . Ectopic pregnancy 2011    no tx needed, "It absorbed itself"  . Depression     Wellbutrin 150 mg daily   Past Surgical History  Procedure Laterality Date  . Laparoscopy  2000  . Tonsillectomy  12/98  . Colposcopy  2005   Family History  Problem Relation Age of Onset  . Hyperlipidemia Mother   . Hypertension Mother     controlled by diet  . Cancer Father 40    malignant  . Hyperlipidemia Maternal Aunt   . Depression Maternal Aunt   . Bipolar disorder Maternal Aunt   . Hypertension Maternal Aunt   . Hypertension Maternal Grandfather    . Heart disease Paternal Grandfather    History  Substance Use Topics  . Smoking status: Never Smoker   . Smokeless tobacco: Never Used  . Alcohol Use: Yes     Comment: occasionally   OB History   Grav Para Term Preterm Abortions TAB SAB Ect Mult Living   2 1 1  0 1 0 0 1 0 1     Review of Systems  Gastrointestinal: Positive for nausea, vomiting and diarrhea.  All other systems reviewed and are negative.    Allergies  Review of patient's allergies indicates no known allergies.  Home Medications   Current Outpatient Rx  Name  Route  Sig  Dispense  Refill  . buPROPion (WELLBUTRIN XL) 150 MG 24 hr tablet   Oral   Take 150 mg by mouth daily.         Marland Kitchen levonorgestrel (MIRENA) 20 MCG/24HR IUD   Intrauterine   1 each by Intrauterine route once. February 2014         . ondansetron (ZOFRAN) 4 MG tablet   Oral   Take 1 tablet (4 mg total) by mouth every 6 (six) hours. Prn n/v   12 tablet   0   . Prenatal Vit-Fe Fumarate-FA (PRENATAL MULTIVITAMIN) TABS   Oral   Take 1 tablet by mouth daily.  Pulse 84  Temp(Src) 100 F (37.8 C) (Oral)  Resp 16  SpO2 98%  Breastfeeding? Yes Physical Exam  Nursing note and vitals reviewed. Constitutional: She appears well-developed and well-nourished.  HENT:  Head: Normocephalic and atraumatic.  Mouth/Throat: Oropharynx is clear and moist.  Neck: Normal range of motion. Neck supple.  Cardiovascular: Normal rate, regular rhythm and normal heart sounds.   Pulmonary/Chest: Effort normal and breath sounds normal.  Abdominal: Soft. Bowel sounds are normal. She exhibits no distension and no mass. There is tenderness. There is no rebound, no guarding, no tenderness at McBurney's point and negative Murphy's sign.  Mild epigastric tenderness to palpation  Neurological: She is alert.  Skin: Skin is warm and dry.  Psychiatric: She has a normal mood and affect.    ED Course  Procedures (including critical care time) Labs  Review Labs Reviewed  CBC WITH DIFFERENTIAL  COMPREHENSIVE METABOLIC PANEL  LIPASE, BLOOD  URINALYSIS, ROUTINE W REFLEX MICROSCOPIC   Imaging Review No results found.  10:30 PM Reassessed patient.  She reports that her nausea is improving.  Patient tolerating PO liquids.  MDM  No diagnosis found. Patient presenting with nausea, vomiting, and diarrhea.  Husband with similar symptoms.  On exam, mild epigastric tenderness to palpation.  No RUQ tenderness.  No rebound or guarding.  Labs unremarkable.  Patient given IV Zofran in the ED and symptoms improved.  Patient able to tolerate PO liquids.  Patient stable for discharge.  Patient given prescription for Phenergan as she reports that has helped her in the past.    Magnus Sinning, PA-C 09/17/13 2303

## 2013-09-17 NOTE — ED Provider Notes (Signed)
CSN: [id number]     Arrival date & time 09/17/13  1019 History   First MD Initiated Contact with Patient 09/17/13 1055     Chief Complaint  Patient presents with  . Nausea  . Diarrhea   (Consider location/radiation/quality/duration/timing/severity/associated sxs/prior Treatment) Patient is a 32 y.o. female presenting with diarrhea. The history is provided by the patient and a parent.  Diarrhea Quality:  Watery Severity:  Moderate Onset quality:  Sudden Number of episodes:  7 Duration:  6 hours Progression:  Unchanged Relieved by:  None tried Worsened by:  Nothing tried Ineffective treatments:  None tried Associated symptoms: abdominal pain and vomiting   Associated symptoms: no fever   Risk factors: sick contacts and suspect food intake   Risk factors comment:  Husband with similar sx, ate chicken wings last eve., pt is 29mo post partum.Marland Kitchen   Past Medical History  Diagnosis Date  . GERD (gastroesophageal reflux disease)   . IBS (irritable bowel syndrome)   . Abnormal Pap smear   . Compression fracture of T12 vertebra 2000  . Ectopic pregnancy 2011    no tx needed, "It absorbed itself"  . Depression     Wellbutrin 150 mg daily   Past Surgical History  Procedure Laterality Date  . Laparoscopy  2000  . Tonsillectomy  12/98  . Colposcopy  2005   Family History  Problem Relation Age of Onset  . Hyperlipidemia Mother   . Hypertension Mother     controlled by diet  . Cancer Father 40    malignant  . Hyperlipidemia Maternal Aunt   . Depression Maternal Aunt   . Bipolar disorder Maternal Aunt   . Hypertension Maternal Aunt   . Hypertension Maternal Grandfather   . Heart disease Paternal Grandfather    History  Substance Use Topics  . Smoking status: Never Smoker   . Smokeless tobacco: Not on file  . Alcohol Use: No   OB History   Grav Para Term Preterm Abortions TAB SAB Ect Mult Living   2 1 1  0 1 0 0 1 0 1     Review of Systems  Constitutional: Negative for  fever.  Gastrointestinal: Positive for nausea, vomiting, abdominal pain and diarrhea. Negative for blood in stool and rectal pain.    Allergies  Review of patient's allergies indicates no known allergies.  Home Medications   Current Outpatient Rx  Name  Route  Sig  Dispense  Refill  . acetaminophen (TYLENOL) 325 MG tablet   Oral   Take 325 mg by mouth as needed. Used for headache.         Marland Kitchen buPROPion (WELLBUTRIN XL) 150 MG 24 hr tablet   Oral   Take 150 mg by mouth daily.         . famotidine (PEPCID) 20 MG tablet   Oral   Take 20 mg by mouth daily.         Marland Kitchen ibuprofen (ADVIL,MOTRIN) 600 MG tablet   Oral   Take 1 tablet (600 mg total) by mouth every 6 (six) hours.   30 tablet   0   . ondansetron (ZOFRAN) 4 MG tablet   Oral   Take 1 tablet (4 mg total) by mouth every 6 (six) hours. Prn n/v   12 tablet   0   . OVER THE COUNTER MEDICATION   Oral   Take 2 tablets by mouth daily. Patient takes blue cohosh         . Prenatal Vit-Fe  Fumarate-FA (PRENATAL MULTIVITAMIN) TABS   Oral   Take 1 tablet by mouth daily.          BP 114/67  Pulse 80  Temp(Src) 97.9 F (36.6 C) (Oral)  Resp 20  SpO2 100%  Breastfeeding? Yes Physical Exam  Constitutional: She is oriented to person, place, and time. She appears well-developed and well-nourished. She appears distressed.  HENT:  Mouth/Throat: Oropharynx is clear and moist.  Eyes: Conjunctivae are normal. Pupils are equal, round, and reactive to light.  Neck: Normal range of motion. Neck supple.  Abdominal: Soft. Normal appearance. She exhibits no mass. Bowel sounds are increased. There is tenderness in the epigastric area. There is no rigidity, no rebound, no guarding, no CVA tenderness, no tenderness at McBurney's point and negative Murphy's sign.  Lymphadenopathy:    She has no cervical adenopathy.  Neurological: She is alert and oriented to person, place, and time.  Skin: Skin is warm and dry.    ED Course   Procedures (including critical care time) Labs Review Labs Reviewed  STOOL CULTURE   Imaging Review No results found.  MDM  Sx improved after ivf and med.    Linna Hoff, MD 09/17/13 [phone]

## 2013-09-17 NOTE — ED Notes (Signed)
Pt states that she has been having n/v and diarrhea since around 0430 this morning. Pt went to urgent care this morning and dx of gastroenteritis (infection) was given. A stool sample was collected at the [facility] for evaluation.

## 2013-09-17 NOTE — ED Notes (Signed)
Called to assess pt from [facility].  Pt in wheelchair.  States she was woken up by her baby (pt is breastfeeding) @ 0530 and suddenly started with vomiting and diarrhea.  Spouse started with same sxs approx 5 min later (both ate chicken wings last night).  Also c/o feeling weak, dizzy, and like her heart is racing with exertion.  Current VS: 114/67, HR 80 and regular, RR 20, SaO2 100%, temp 97.9.  Has had watery diarrhea 6-7x since 0600, and vomiting 2-3x.  Pt and mother reassured that her VS are stable at this time, that she should be able to return to [facility] as the wait for an exam room is not lengthy at this time.  Informed pt & mother to notify us if anything changes, or she starts to feel lightheaded.  Both verbalized understanding.

## 2013-09-18 NOTE — ED Provider Notes (Signed)
Medical screening examination/treatment/procedure(s) were performed by non-physician practitioner and as supervising physician I was immediately available for consultation/collaboration.  Ewell Benassi M Dasani Thurlow, MD 09/18/13 1336 

## 2013-09-20 NOTE — ED Notes (Signed)
Sample held for regrowth; pending final reports

## 2013-09-20 NOTE — ED Notes (Signed)
Chart review for [facility] lab only

## 2013-09-21 LAB — STOOL CULTURE

## 2013-09-21 NOTE — ED Notes (Signed)
After  Id  Verified   Lab  Results  Given to pt

## 2013-09-22 NOTE — ED Notes (Signed)
Review of labs from [facility] visit, shows no salmonella, shigella, campylobacter, yersinia, or e coli isolated on final report. Should be noted pt went to ED for further treatment after released from [facility]

## 2014-08-26 LAB — OB RESULTS CONSOLE ABO/RH: RH Type: NEGATIVE

## 2014-08-26 LAB — OB RESULTS CONSOLE HEPATITIS B SURFACE ANTIGEN: Hepatitis B Surface Ag: NEGATIVE

## 2014-08-26 LAB — OB RESULTS CONSOLE RPR: RPR: NONREACTIVE

## 2014-08-26 LAB — OB RESULTS CONSOLE HIV ANTIBODY (ROUTINE TESTING): HIV: NONREACTIVE

## 2014-08-26 LAB — OB RESULTS CONSOLE ANTIBODY SCREEN: Antibody Screen: NEGATIVE

## 2014-08-26 LAB — OB RESULTS CONSOLE RUBELLA ANTIBODY, IGM: Rubella: IMMUNE

## 2014-09-01 LAB — OB RESULTS CONSOLE GC/CHLAMYDIA
Chlamydia: NEGATIVE
Gonorrhea: NEGATIVE

## 2014-10-31 ENCOUNTER — Encounter (HOSPITAL_COMMUNITY): Payer: Self-pay | Admitting: Emergency Medicine

## 2014-12-30 NOTE — L&D Delivery Note (Signed)
Called to delivery while Dr. Ernestina PennaFogleman   Delivery Note  First Stage: Labor onset: 04/03/2015 1720 Augmentation : Pitocin Analgesia Eliezer Lofts/Anesthesia intrapartum: Epidural SROM at 04/03/2015 1720  Second Stage: Complete dilation at 0825 Onset of pushing at 0825 FHR second stage 120 bpm  Delivery of a viable female at 140857 by CNM in LOA position No nuchal cord Cord double clamped after cessation of pulsation, cut by FOB Cord blood sample collected   Third Stage: Placenta delivered via Tomasa BlaseSchultz intact with 3 VC @ 0857 Placenta disposition: L&D  Uterine tone firm / bleeding moderate  2nd degree perineal laceration identified  Anesthesia for repair: Epidural Repair 3.0 vicryl rapide Est. Blood Loss (mL): 250  Complications: none  Mom to postpartum.  Baby to Couplet care / Skin to Skin.  Newborn: Birth Weight: 7 lbs 15.7 oz  Apgar Scores: 9/9 Feeding planned: breast  Raelyn MoraAWSON, Makaylah Oddo, M  MSN, CNM 04/04/2015, 11:19 AM

## 2015-02-24 LAB — OB RESULTS CONSOLE GBS: GBS: POSITIVE

## 2015-04-03 ENCOUNTER — Inpatient Hospital Stay (HOSPITAL_COMMUNITY)
Admission: AD | Admit: 2015-04-03 | Discharge: 2015-04-03 | Disposition: A | Discharge status: Home / Self Care | Payer: 59 | Source: Ambulatory Visit | Attending: Obstetrics & Gynecology | Admitting: Obstetrics & Gynecology

## 2015-04-03 ENCOUNTER — Encounter (HOSPITAL_COMMUNITY): Payer: Self-pay | Admitting: *Deleted

## 2015-04-03 ENCOUNTER — Inpatient Hospital Stay (HOSPITAL_COMMUNITY)
Admission: AD | Admit: 2015-04-03 | Discharge: 2015-04-06 | DRG: 775 | Disposition: A | Discharge status: Home / Self Care | Payer: 59 | Source: Ambulatory Visit | Attending: Obstetrics | Admitting: Obstetrics

## 2015-04-03 ENCOUNTER — Encounter (HOSPITAL_COMMUNITY): Payer: Self-pay

## 2015-04-03 DIAGNOSIS — Z3A40 40 weeks gestation of pregnancy: Secondary | ICD-10-CM | POA: Diagnosis present

## 2015-04-03 DIAGNOSIS — O99344 Other mental disorders complicating childbirth: Secondary | ICD-10-CM | POA: Diagnosis present

## 2015-04-03 DIAGNOSIS — O99820 Streptococcus B carrier state complicating pregnancy: Secondary | ICD-10-CM | POA: Insufficient documentation

## 2015-04-03 DIAGNOSIS — O480 Post-term pregnancy: Principal | ICD-10-CM | POA: Diagnosis present

## 2015-04-03 DIAGNOSIS — Z6791 Unspecified blood type, Rh negative: Secondary | ICD-10-CM

## 2015-04-03 DIAGNOSIS — F53 Puerperal psychosis: Secondary | ICD-10-CM | POA: Diagnosis present

## 2015-04-03 DIAGNOSIS — N898 Other specified noninflammatory disorders of vagina: Secondary | ICD-10-CM | POA: Diagnosis not present

## 2015-04-03 DIAGNOSIS — O99824 Streptococcus B carrier state complicating childbirth: Secondary | ICD-10-CM | POA: Diagnosis present

## 2015-04-03 DIAGNOSIS — O26899 Other specified pregnancy related conditions, unspecified trimester: Secondary | ICD-10-CM | POA: Diagnosis present

## 2015-04-03 DIAGNOSIS — Z3483 Encounter for supervision of other normal pregnancy, third trimester: Secondary | ICD-10-CM | POA: Diagnosis present

## 2015-04-03 DIAGNOSIS — Z8659 Personal history of other mental and behavioral disorders: Secondary | ICD-10-CM

## 2015-04-03 HISTORY — DX: Unspecified abnormal cytological findings in specimens from vagina: R87.629

## 2015-04-03 LAB — CBC
HCT: 35.7 % — ABNORMAL LOW (ref 36.0–46.0)
Hemoglobin: 12.5 g/dL (ref 12.0–15.0)
MCH: 30.6 pg (ref 26.0–34.0)
MCHC: 35 g/dL (ref 30.0–36.0)
MCV: 87.3 fL (ref 78.0–100.0)
Platelets: 171 10*3/uL (ref 150–400)
RBC: 4.09 MIL/uL (ref 3.87–5.11)
RDW: 13.2 % (ref 11.5–15.5)
WBC: 12.6 10*3/uL — ABNORMAL HIGH (ref 4.0–10.5)

## 2015-04-03 LAB — POCT FERN TEST: POCT Fern Test: POSITIVE

## 2015-04-03 MED ORDER — OXYTOCIN BOLUS FROM INFUSION: 500.0000 mL | INTRAVENOUS | Status: DC

## 2015-04-03 MED ORDER — ONDANSETRON HCL 4 MG/2ML IJ SOLN: 4.0000 mg | Freq: Four times a day (QID) | INTRAMUSCULAR | Status: DC | PRN

## 2015-04-03 MED ORDER — FLEET ENEMA 7-19 GM/118ML RE ENEM: 1.0000 | ENEMA | RECTAL | Status: DC | PRN

## 2015-04-03 MED ORDER — LIDOCAINE HCL (PF) 1 % IJ SOLN
30.0000 mL | INTRAMUSCULAR | Status: DC | PRN
  Filled 2015-04-03: qty 30

## 2015-04-03 MED ORDER — DEXTROSE 5 % IV SOLN
2.5000 10*6.[IU] | INTRAVENOUS | Status: DC
  Administered 2015-04-04 (×2): 2.5 10*6.[IU] via INTRAVENOUS
  Filled 2015-04-03 (×5): qty 2.5

## 2015-04-03 MED ORDER — DEXTROSE 5 % IV SOLN
5.0000 10*6.[IU] | Freq: Once | INTRAVENOUS | Status: AC
  Administered 2015-04-03: 5 10*6.[IU] via INTRAVENOUS
  Filled 2015-04-03: qty 5

## 2015-04-03 MED ORDER — CITRIC ACID-SODIUM CITRATE 334-500 MG/5ML PO SOLN
30.0000 mL | ORAL | Status: DC | PRN
  Administered 2015-04-04 (×2): 30 mL via ORAL
  Filled 2015-04-03 (×2): qty 15

## 2015-04-03 MED ORDER — OXYTOCIN 40 UNITS IN LACTATED RINGERS INFUSION - SIMPLE MED
62.5000 mL/h | INTRAVENOUS | Status: DC
  Filled 2015-04-03: qty 1000

## 2015-04-03 MED ORDER — OXYCODONE-ACETAMINOPHEN 5-325 MG PO TABS: 1.0000 | ORAL_TABLET | ORAL | Status: DC | PRN

## 2015-04-03 MED ORDER — LACTATED RINGERS IV SOLN
INTRAVENOUS | Status: DC
  Administered 2015-04-03 – 2015-04-04 (×2): via INTRAVENOUS

## 2015-04-03 MED ORDER — ACETAMINOPHEN 325 MG PO TABS
650.0000 mg | ORAL_TABLET | ORAL | Status: DC | PRN
Start: 2015-04-03 — End: 2015-04-04

## 2015-04-03 MED ORDER — LACTATED RINGERS IV SOLN: 500.0000 mL | INTRAVENOUS | Status: DC | PRN

## 2015-04-03 MED ORDER — OXYCODONE-ACETAMINOPHEN 5-325 MG PO TABS: 2.0000 | ORAL_TABLET | ORAL | Status: DC | PRN

## 2015-04-03 NOTE — MAU Note (Signed)
Urine in lab 

## 2015-04-03 NOTE — MAU Note (Signed)
Pt states ctx's since midnight, now q5 minutes apart. Was 3/60% last Tuesday. Increased vaginal discharge. No bleeding or gush of fluid.

## 2015-04-03 NOTE — MAU Note (Signed)
Pt presents with contractions and ROM at 1720.

## 2015-04-03 NOTE — H&P (Signed)
Erica Estes is a 34 y.o. G3P1011 at 5183w4d presenting for SROM. Pt notes rare contractions, increasing slightly since presentation to MAU . Good fetal movement, No vaginal bleeding, started leaking fluid at 5:20p.  [facility] 7 wks - dated by LMP c/w 7 wk u/s - O neg, s/p Rhogam - depression, on Wellbutrin - low lying placenta, resolved at 30 wks - GBS pos   Prenatal Transfer Tool  Maternal Diabetes: No Genetic Screening: Normal Maternal Ultrasounds/Referrals: Normal Fetal Ultrasounds or other Referrals:  None Maternal Substance Abuse:  No Significant Maternal Medications:  None Significant Maternal Lab Results: None     OB History    Gravida Para Term Preterm AB TAB SAB Ectopic Multiple Living   3 1 1  0 1 0 0 1 0 1     Past Medical History  Diagnosis Date  . GERD (gastroesophageal reflux disease)   . IBS (irritable bowel syndrome)   . Abnormal Pap smear   . Compression fracture of T12 vertebra 2000  . Ectopic pregnancy 2011    no tx needed, "It absorbed itself"  . Depression     Wellbutrin 150 mg daily  . Vaginal Pap smear, abnormal    Past Surgical History  Procedure Laterality Date  . Laparoscopy  2000  . Tonsillectomy  12/98  . Colposcopy  2005   Family History: family history includes Bipolar disorder in her maternal aunt; Cancer (age of onset: 6440) in her father; Depression in her maternal aunt; Heart disease in her paternal grandfather; Hyperlipidemia in her maternal aunt and mother; Hypertension in her maternal aunt, maternal grandfather, and mother. Social History:  reports that she has never smoked. She has never used smokeless tobacco. She reports that she drinks alcohol. She reports that she does not use illicit drugs.  Review of Systems - Negative except ROM, contractions   Dilation: 3 Effacement (%): 50 Station: -1 Exam by:: Darrold SpanK. Mintie Witherington MD Blood pressure 127/78, pulse 95, temperature 98.7 F (37.1 C), temperature source Oral,  resp. rate 18, height 5\' 7"  (1.702 m), weight 79.379 kg (175 lb), SpO2 100 %. Gross ROM, fern positive per RN  Physical Exam:  Gen: well appearing, no distress Back: no CVAT Abd: gravid, NT, no RUQ pain LE: no edema, equal bilaterally, non-tender Toco: irreg, q 5 min FH: baseline 130s, accelerations present, no deceleratons, 10 beat variability  Prenatal labs: ABO, Rh: O/Negative/-- (08/28 0000) Antibody: Negative (08/28 0000) Rubella:   RPR: Nonreactive (08/28 0000)  HBsAg: Negative (08/28 0000)  HIV: Non-reactive (08/28 0000)  GBS: Positive (02/26 0000)  1 hr Glucola 98   Genetic screening nl quad Anatomy US nl   Assessment/Plan: 34 y.o. G3P1011 at 4483w4d SROM, pt starting to have ctx, would like expectant management at this point due to desire for natural labor. D/w pt can allow 6 from SROM and then will discuss pitocin.  GBS pos. Start PCN  Depression, cont Wellbutrin  Erica Estes A. 04/03/2015, 8:41 PM

## 2015-04-03 NOTE — MAU Note (Signed)
PT  SAYS SHE STARTED  HURTING  BAD AT 0300-   SHE CALLED TANYA, CNM  AT 0600.    VE  LAST  Tuesday    WAS 3 CM.     DENIES HSV AND MRSA.   GBS- POS.

## 2015-04-04 ENCOUNTER — Encounter (HOSPITAL_COMMUNITY): Payer: Self-pay | Admitting: Obstetrics and Gynecology

## 2015-04-04 ENCOUNTER — Inpatient Hospital Stay (HOSPITAL_COMMUNITY): Payer: 59 | Admitting: Anesthesiology

## 2015-04-04 LAB — RPR: RPR Ser Ql: NONREACTIVE

## 2015-04-04 MED ORDER — LACTATED RINGERS IV SOLN: 500.0000 mL | Freq: Once | INTRAVENOUS | Status: DC

## 2015-04-04 MED ORDER — ONDANSETRON HCL 4 MG PO TABS: 4.0000 mg | ORAL_TABLET | ORAL | Status: DC | PRN

## 2015-04-04 MED ORDER — PHENYLEPHRINE 40 MCG/ML (10ML) SYRINGE FOR IV PUSH (FOR BLOOD PRESSURE SUPPORT)
80.0000 ug | PREFILLED_SYRINGE | INTRAVENOUS | Status: DC | PRN
  Filled 2015-04-04: qty 2

## 2015-04-04 MED ORDER — TETANUS-DIPHTH-ACELL PERTUSSIS 5-2.5-18.5 LF-MCG/0.5 IM SUSP: 0.5000 mL | Freq: Once | INTRAMUSCULAR | Status: DC

## 2015-04-04 MED ORDER — ACETAMINOPHEN 325 MG PO TABS: 650.0000 mg | ORAL_TABLET | ORAL | Status: DC | PRN

## 2015-04-04 MED ORDER — BUPROPION HCL ER (XL) 150 MG PO TB24
150.0000 mg | ORAL_TABLET | Freq: Every day | ORAL | Status: DC
  Administered 2015-04-04 – 2015-04-05 (×2): 150 mg via ORAL
  Filled 2015-04-04 (×3): qty 1

## 2015-04-04 MED ORDER — IBUPROFEN 600 MG PO TABS
600.0000 mg | ORAL_TABLET | Freq: Four times a day (QID) | ORAL | Status: DC
  Administered 2015-04-04 – 2015-04-06 (×8): 600 mg via ORAL
  Filled 2015-04-04 (×8): qty 1

## 2015-04-04 MED ORDER — PHENYLEPHRINE 40 MCG/ML (10ML) SYRINGE FOR IV PUSH (FOR BLOOD PRESSURE SUPPORT)
PREFILLED_SYRINGE | INTRAVENOUS | Status: AC
  Filled 2015-04-04: qty 20

## 2015-04-04 MED ORDER — FENTANYL 2.5 MCG/ML BUPIVACAINE 1/10 % EPIDURAL INFUSION (WH - ANES): 14.0000 mL/h | INTRAMUSCULAR | Status: DC | PRN

## 2015-04-04 MED ORDER — EPHEDRINE 5 MG/ML INJ
10.0000 mg | INTRAVENOUS | Status: DC | PRN
Start: 2015-04-04 — End: 2015-04-04
  Filled 2015-04-04: qty 2

## 2015-04-04 MED ORDER — EPHEDRINE 5 MG/ML INJ
10.0000 mg | INTRAVENOUS | Status: DC | PRN
  Filled 2015-04-04: qty 2

## 2015-04-04 MED ORDER — FENTANYL 2.5 MCG/ML BUPIVACAINE 1/10 % EPIDURAL INFUSION (WH - ANES)
INTRAMUSCULAR | Status: DC | PRN
  Administered 2015-04-04: 14 mL/h via EPIDURAL

## 2015-04-04 MED ORDER — METHYLERGONOVINE MALEATE 0.2 MG PO TABS: 0.2000 mg | ORAL_TABLET | ORAL | Status: DC | PRN

## 2015-04-04 MED ORDER — OXYTOCIN 40 UNITS IN LACTATED RINGERS INFUSION - SIMPLE MED
1.0000 m[IU]/min | INTRAVENOUS | Status: DC
  Administered 2015-04-04: 666 m[IU]/min via INTRAVENOUS
  Administered 2015-04-04: 2 m[IU]/min via INTRAVENOUS

## 2015-04-04 MED ORDER — WITCH HAZEL-GLYCERIN EX PADS: 1.0000 "application " | MEDICATED_PAD | CUTANEOUS | Status: DC | PRN

## 2015-04-04 MED ORDER — EPHEDRINE 5 MG/ML INJ: 10.0000 mg | INTRAVENOUS | Status: DC | PRN

## 2015-04-04 MED ORDER — PHENYLEPHRINE 40 MCG/ML (10ML) SYRINGE FOR IV PUSH (FOR BLOOD PRESSURE SUPPORT)
80.0000 ug | PREFILLED_SYRINGE | INTRAVENOUS | Status: DC | PRN
Start: 2015-04-04 — End: 2015-04-04

## 2015-04-04 MED ORDER — PHENYLEPHRINE 40 MCG/ML (10ML) SYRINGE FOR IV PUSH (FOR BLOOD PRESSURE SUPPORT): 80.0000 ug | PREFILLED_SYRINGE | INTRAVENOUS | Status: DC | PRN

## 2015-04-04 MED ORDER — ONDANSETRON HCL 4 MG/2ML IJ SOLN: 4.0000 mg | INTRAMUSCULAR | Status: DC | PRN

## 2015-04-04 MED ORDER — FENTANYL 2.5 MCG/ML BUPIVACAINE 1/10 % EPIDURAL INFUSION (WH - ANES)
INTRAMUSCULAR | Status: AC
  Filled 2015-04-04: qty 125

## 2015-04-04 MED ORDER — DIPHENHYDRAMINE HCL 25 MG PO CAPS: 25.0000 mg | ORAL_CAPSULE | Freq: Four times a day (QID) | ORAL | Status: DC | PRN

## 2015-04-04 MED ORDER — ZOLPIDEM TARTRATE 5 MG PO TABS: 5.0000 mg | ORAL_TABLET | Freq: Every evening | ORAL | Status: DC | PRN

## 2015-04-04 MED ORDER — LIDOCAINE HCL (PF) 1 % IJ SOLN
INTRAMUSCULAR | Status: DC | PRN
  Administered 2015-04-04 (×2): 4 mL

## 2015-04-04 MED ORDER — TERBUTALINE SULFATE 1 MG/ML IJ SOLN
0.2500 mg | Freq: Once | INTRAMUSCULAR | Status: DC | PRN
  Filled 2015-04-04: qty 1

## 2015-04-04 MED ORDER — METHYLERGONOVINE MALEATE 0.2 MG/ML IJ SOLN
0.2000 mg | INTRAMUSCULAR | Status: DC | PRN
Start: 2015-04-04 — End: 2015-04-06

## 2015-04-04 MED ORDER — OXYCODONE-ACETAMINOPHEN 5-325 MG PO TABS: 1.0000 | ORAL_TABLET | ORAL | Status: DC | PRN

## 2015-04-04 MED ORDER — SENNOSIDES-DOCUSATE SODIUM 8.6-50 MG PO TABS
2.0000 | ORAL_TABLET | ORAL | Status: DC
  Administered 2015-04-05 (×2): 2 via ORAL
  Filled 2015-04-04 (×2): qty 2

## 2015-04-04 MED ORDER — SIMETHICONE 80 MG PO CHEW: 80.0000 mg | CHEWABLE_TABLET | ORAL | Status: DC | PRN

## 2015-04-04 MED ORDER — BENZOCAINE-MENTHOL 20-0.5 % EX AERO: 1.0000 "application " | INHALATION_SPRAY | CUTANEOUS | Status: DC | PRN

## 2015-04-04 MED ORDER — DIPHENHYDRAMINE HCL 50 MG/ML IJ SOLN: 12.5000 mg | INTRAMUSCULAR | Status: DC | PRN

## 2015-04-04 MED ORDER — OXYCODONE-ACETAMINOPHEN 5-325 MG PO TABS: 2.0000 | ORAL_TABLET | ORAL | Status: DC | PRN

## 2015-04-04 MED ORDER — PRENATAL MULTIVITAMIN CH
1.0000 | ORAL_TABLET | Freq: Every day | ORAL | Status: DC
  Administered 2015-04-04 – 2015-04-05 (×2): 1 via ORAL
  Filled 2015-04-04 (×2): qty 1

## 2015-04-04 MED ORDER — DIBUCAINE 1 % RE OINT: 1.0000 "application " | TOPICAL_OINTMENT | RECTAL | Status: DC | PRN

## 2015-04-04 NOTE — Anesthesia Procedure Notes (Signed)
Epidural Patient location during procedure: OB Start time: 04/04/2015 7:17 AM End time: 04/04/2015 7:27 AM  Staffing Anesthesiologist: Heather RobertsSINGER, Erica Ramo  Preanesthetic Checklist Completed: patient identified, site marked, surgical consent, pre-op evaluation, timeout performed, IV checked, risks and benefits discussed and monitors and equipment checked  Epidural Patient position: sitting Prep: site prepped and draped and DuraPrep Patient monitoring: continuous pulse ox and blood pressure Approach: midline Location: L3-L4 Injection technique: LOR saline  Needle:  Needle type: Tuohy  Needle gauge: 17 G Needle length: 9 cm and 9 Needle insertion depth: 5 cm cm Catheter type: closed end flexible Catheter size: 19 Gauge Catheter at skin depth: 9 cm Test dose: negative  Assessment Events: blood not aspirated, injection not painful, no injection resistance, negative IV test and no paresthesia

## 2015-04-04 NOTE — Anesthesia Preprocedure Evaluation (Signed)
Anesthesia Evaluation  Patient identified by MRN, date of birth, ID band Patient awake    Reviewed: Allergy & Precautions, NPO status , Patient's Chart, lab work & pertinent test results  History of Anesthesia Complications Negative for: history of anesthetic complications  Airway Mallampati: II  TM Distance: >3 FB Neck ROM: Full    Dental  (+) Teeth Intact, Dental Advisory Given   Pulmonary neg pulmonary ROS,    Pulmonary exam normal       Cardiovascular negative cardio ROS      Neuro/Psych Depression negative neurological ROS     GI/Hepatic Neg liver ROS, GERD-  ,  Endo/Other  negative endocrine ROS  Renal/GU negative Renal ROS  negative genitourinary   Musculoskeletal negative musculoskeletal ROS (+)   Abdominal   Peds negative pediatric ROS (+)  Hematology negative hematology ROS (+)   Anesthesia Other Findings   Reproductive/Obstetrics negative OB ROS                             Anesthesia Physical Anesthesia Plan  ASA: II  Anesthesia Plan: Epidural   Post-op Pain Management:    Induction:   Airway Management Planned:   Additional Equipment:   Intra-op Plan:   Post-operative Plan:   Informed Consent:   Plan Discussed with:   Anesthesia Plan Comments:         Anesthesia Quick Evaluation

## 2015-04-04 NOTE — Lactation Note (Signed)
This note was copied from the chart of Boy Kathryne Erikssonrin Krus. Lactation Consultation Note  Patient Name: Boy Kathryne Erikssonrin Jurgens ZOXWR'UToday's Date: 04/04/2015   Baby 11 hours of life. [facility], but room full of visitors. [facility] mom [facility] is ready.   Maternal Data    Feeding Feeding Type: Breast Milk Length of feed: 0 min (few sucks)  LATCH Score/Interventions                      Lactation Tools Discussed/Used     Consult Status      Geralynn OchsWILLIARD, Mance Vallejo 04/04/2015, 7:53 PM

## 2015-04-04 NOTE — Progress Notes (Signed)
S: Doing well, no complaints, pain increasing significantly over the last hour, hoping to avoid pain medication.   O: BP 124/75 mmHg  Pulse 101  Temp(Src) 97.7 F (36.5 C) (Oral)  Resp 18  Ht 5\' 7"  (1.702 m)  Wt 79.379 kg (175 lb)  BMI 27.40 kg/m2  SpO2 100%   FHT:  FHR: 120s bpm, variability: moderate,  accelerations:  Present,  decelerations:  Present occasional early and variable decels through the night UC:   regular, every 2 minutes; pit at 18 munits/ min SVE:   Dilation: 3.5 Effacement (%): 60 Station: -1 Exam by:: Rogelio SeenMcCall, RN    A / P:  34 y.o.  Obstetric History   G3   P1   T1   P0   A1   TAB0   SAB0   E1   M0   L1    at 10143w5d SROM, expectant management for 6 hrs per pt wishes, pitocin started, just entering active labor  Fetal Wellbeing:  Category I Pain Control:  Labor support without medications  Anticipated MOD:  NSVD  Maragret Vanacker A. 04/04/2015, 6:55 AM

## 2015-04-04 NOTE — Anesthesia Postprocedure Evaluation (Signed)
  Anesthesia Post-op Note  Patient: Erica Estes  Procedure(s) Performed: * No procedures listed *  Patient Location: PACU and Mother/Baby  Anesthesia Type:Epidural  Level of Consciousness: awake, alert  and oriented  Airway and Oxygen Therapy: Patient Spontanous Breathing  Post-op Pain: none  Post-op Assessment: Post-op Vital signs reviewed  Post-op Vital Signs: Reviewed and stable  Last Vitals:  Filed Vitals:   04/04/15 1202  BP: 124/57  Pulse: 64  Temp: 36.6 C  Resp: 20    Complications: No apparent anesthesia complications

## 2015-04-04 NOTE — Lactation Note (Deleted)
This note was copied from the chart of Erica Estes. Lactation Consultation Note  Patient Name: Erica Kathryne Erikssonrin Badal ZOXWR'UToday's Date: 04/04/2015   Baby 11 hours of life. Mom has a room full of visitors. Asked mom to [facility] when it is a good time to assess a latch.  Maternal Data    Feeding Feeding Type: Breast Milk Length of feed: 0 min (few sucks)  LATCH Score/Interventions                      Lactation Tools Discussed/Used     Consult Status      Geralynn OchsWILLIARD, Yee Joss 04/04/2015, 8:03 PM

## 2015-04-04 NOTE — Lactation Note (Signed)
This note was copied from the chart of Boy Kathryne Erikssonrin Gibbs. Lactation Consultation Note  Patient Name: Boy Kathryne Erikssonrin Mccollom ZOXWR'UToday's Date: 04/04/2015 Reason for consult: Initial assessment  Baby 13 hours of life. [facility]. Mom reports that baby has been spitting up some. Mom attempted to latch baby in cradle position. Assisted mom to place baby in football position, and enc mom to use this position in order to get a deeper latch with this newborn. Baby swallowing hard and not wanting to latch. Baby not even interested in suckling LC's gloved finger. Mom states that her first baby was a also a quick delivery and was not interested in nursing much for the first 24 hours. Mom states that she had a good supply with first baby, and did use a NS for a week or so. Enc mom to offer lots of STS and nurse with cues and at least attempt 8-12 times/24 hours. Enc mom to keep attempting to latch without a NS as her nipples are everted. Mom states that her nipples were more flat with first baby. Enc mom to call for assistance with latching as needed. Mom states that she has been hand expressing drops of colostrum into baby's mouth as well. Mom given [facility] broch[facility], aware of OP/BFSG, community resources, and [facility] phone line assistance after D/C.  Maternal Data Has patient been taught Hand Expression?: Yes Does the patient have breastfeeding experience prior to this delivery?: Yes  Feeding Feeding Type: Breast Fed Length of feed: 0 min  LATCH Score/Interventions                      Lactation Tools Discussed/Used     Consult Status Consult Status: Follow-up Date: 04/05/15 Follow-up type: In-patient    Geralynn OchsWILLIARD, Jalon Squier 04/04/2015, 10:10 PM

## 2015-04-04 NOTE — Progress Notes (Signed)
MOB was referred for history of depression/anxiety.  Referral is screened out by Clinical Social Worker because none of the following criteria appear to apply: -History of anxiety/depression during this pregnancy, or of post-partum depression. - Diagnosis of anxiety and/or depression within last 3 years or -MOB's symptoms are currently being treated with medication and/or therapy.  Please contact the Clinical Social Worker if needs arise or upon MOB request.  

## 2015-04-05 DIAGNOSIS — Z8659 Personal history of other mental and behavioral disorders: Secondary | ICD-10-CM

## 2015-04-05 DIAGNOSIS — O26899 Other specified pregnancy related conditions, unspecified trimester: Secondary | ICD-10-CM | POA: Diagnosis present

## 2015-04-05 DIAGNOSIS — Z6791 Unspecified blood type, Rh negative: Secondary | ICD-10-CM

## 2015-04-05 LAB — TYPE AND SCREEN
ABO/RH(D): O NEG
Antibody Screen: POSITIVE
DAT, IgG: NEGATIVE
Unit division: 0
Unit division: 0

## 2015-04-05 LAB — CBC
HCT: 34 % — ABNORMAL LOW (ref 36.0–46.0)
Hemoglobin: 11.5 g/dL — ABNORMAL LOW (ref 12.0–15.0)
MCH: 30.3 pg (ref 26.0–34.0)
MCHC: 33.8 g/dL (ref 30.0–36.0)
MCV: 89.7 fL (ref 78.0–100.0)
Platelets: 150 10*3/uL (ref 150–400)
RBC: 3.79 MIL/uL — ABNORMAL LOW (ref 3.87–5.11)
RDW: 13.4 % (ref 11.5–15.5)
WBC: 10.7 10*3/uL — ABNORMAL HIGH (ref 4.0–10.5)

## 2015-04-05 MED ORDER — RHO D IMMUNE GLOBULIN 1500 UNIT/2ML IJ SOSY
300.0000 ug | PREFILLED_SYRINGE | Freq: Once | INTRAMUSCULAR | Status: AC
  Administered 2015-04-05: 300 ug via INTRAVENOUS
  Filled 2015-04-05: qty 2

## 2015-04-05 NOTE — Progress Notes (Addendum)
PPD #1- SVD  Subjective:   Reports feeling well Tolerating po/ No nausea or vomiting Bleeding is light Pain mostly controlled with Motrin; doesn't want to use Percocet for breakthrough pain Up ad lib / ambulatory / voiding without problems Newborn: breastfeeding  / Circumcision: done   Objective:   VS:  VS:  Filed Vitals:   04/04/15 1202 04/04/15 1631 04/05/15 0018 04/05/15 0700  BP: 124/57 127/82 113/71 110/59  Pulse: 64 74 68 57  Temp: 97.8 F (36.6 C) 97.7 F (36.5 C) 98.2 F (36.8 C) 98.1 F (36.7 C)  TempSrc: Oral Oral Oral   Resp: 20 20 18 18   Height:      Weight:      SpO2:        LABS:  Recent Labs  04/03/15 2045 04/05/15 0610  WBC 12.6* 10.7*  HGB 12.5 11.5*  PLT 171 150   Blood type: --/--/O NEG (04/06 0610) Rubella: Immune (08/28 0000)   I&O: Intake/Output      04/05 0701 - 04/06 0700 04/06 0701 - 04/07 0700   Blood 250    Total Output 250     Net -250            Physical Exam: Alert and oriented x3 Abdomen: soft, non-tender, non-distended  Fundus: firm, non-tender, U-2 Perineum: Well approximated, no significant erythema, edema, or drainage; healing well. Lochia: small Extremities: no edema, no calf pain or tenderness    Assessment:  PPD #1 G3P2012/ S/P:spontaneous vaginal, 2nd degree laceration  Depression-stable on Wellbutrin Rh negative-Rhogam given Doing well    Plan: Discontinue Percocet, Ultram prn severe pain Continue routine post partum orders Anticipate D/C home tomorrow   Donette LarryBHAMBRI, Artia Singley, N MSN, CNM 04/05/2015, 12:01 PM

## 2015-04-06 LAB — RH IG WORKUP (INCLUDES ABO/RH)
ABO/RH(D): O NEG
Fetal Screen: NEGATIVE
Gestational Age(Wks): 40.5
Unit division: 0

## 2015-04-06 MED ORDER — TRAMADOL HCL 50 MG PO TABS: 50.0000 mg | ORAL_TABLET | Freq: Four times a day (QID) | ORAL | Status: DC | PRN

## 2015-04-06 MED ORDER — IBUPROFEN 600 MG PO TABS: 600.0000 mg | ORAL_TABLET | Freq: Four times a day (QID) | ORAL | Status: DC

## 2015-04-06 MED ORDER — ACETAMINOPHEN 325 MG PO TABS: 650.0000 mg | ORAL_TABLET | ORAL | Status: DC | PRN

## 2015-04-06 NOTE — Progress Notes (Signed)
PPD 2 SVD  S:  Reports feeling well             Tolerating po/ No nausea or vomiting             Bleeding is light             Pain controlled with motrin mostly             Up ad lib / ambulatory / voiding QS  Newborn breast-feeding  / Circumcision done O:               VS: BP 116/69 mmHg  Pulse 73  Temp(Src) 98.3 F (36.8 C) (Oral)  Resp 17  Ht 5\' 7"  (1.702 m)  Wt 79.379 kg (175 lb)  BMI 27.40 kg/m2  SpO2 100%  Breastfeeding? Unknown   LABS:              Recent Labs  04/03/15 2045 04/05/15 0610  WBC 12.6* 10.7*  HGB 12.5 11.5*  PLT 171 150               Blood type: --/--/O NEG (04/06 0610)  Rubella: Immune (08/28 0000)                     Physical Exam:             Alert and oriented X3  Abdomen: soft, non-tender, non-distended              Fundus: firm, non-tender, U-1  Perineum: mild edema  Lochia: light  Extremities: no edema, no calf pain or tenderness    A: PPD # 2              O negative / newborn A positive - rhophylac prior to DC             Hx depression stable on Wellbutrin  Doing well - stable status  P: Routine post partum orders  Rhophylac today prior to DC             DC home - [facility] booklet -instructions reviewed             PPDS at 6 weeks with hx depression on medication  Erica Estes, Erica Estes CNM, MSN, [facility] 04/06/2015, 9:13 AM

## 2015-04-06 NOTE — Discharge Summary (Signed)
Obstetric Discharge Summary  Reason for Admission: onset of labor with SROM Prenatal Procedures: none Intrapartum Procedures: spontaneous vaginal delivery, GBS prophylaxis and epidural Postpartum Procedures: Rho(D) Ig Complications-Operative and Postpartum: 2nd degree perineal laceration HEMOGLOBIN  Date Value Ref Range Status  04/05/2015 11.5* 12.0 - 15.0 g/dL Final   HCT  Date Value Ref Range Status  04/05/2015 34.0* 36.0 - 46.0 % Final    Physical Exam:  General: alert, cooperative and no distress Lochia: appropriate Uterine Fundus: firm / perineal healing well DVT Evaluation: No evidence of DVT seen on physical exam.  Discharge Diagnoses: Term Pregnancy-delivered  Discharge Information: Date: 04/06/2015 Activity: pelvic rest Diet: routine Medications: PNV, Ibuprofen and Wellbutrin and tramadol Condition: stable Instructions: refer to practice specific booklet Discharge to: home Follow-up Information    [facility]. Schedule an appointment as soon as possible for a visit in 6 weeks.   Specialty:  Obstetrics and Gynecology   Contact information:   [address] [place] [id number] [phone]       Newborn Data: Live born female  Birth Weight: 7 lb 15.7 oz (3620 g) APGAR: 9, 9  Home with mother.  Marlinda MikeBAILEY, TANYA 04/06/2015, 9:22 AM

## 2016-08-31 DIAGNOSIS — K589 Irritable bowel syndrome without diarrhea: Secondary | ICD-10-CM | POA: Insufficient documentation

## 2018-11-13 ENCOUNTER — Ambulatory Visit: Payer: Managed Care, Other (non HMO) | Admitting: Obstetrics & Gynecology

## 2018-11-13 ENCOUNTER — Encounter: Payer: Self-pay | Admitting: Obstetrics & Gynecology

## 2018-11-13 VITALS — BP 126/80 | Ht 67.0 in | Wt 150.0 lb

## 2018-11-13 DIAGNOSIS — Z30432 Encounter for removal of intrauterine contraceptive device: Secondary | ICD-10-CM | POA: Diagnosis not present

## 2018-11-13 DIAGNOSIS — Z01419 Encounter for gynecological examination (general) (routine) without abnormal findings: Secondary | ICD-10-CM

## 2018-11-13 DIAGNOSIS — Z30015 Encounter for initial prescription of vaginal ring hormonal contraceptive: Secondary | ICD-10-CM

## 2018-11-13 MED ORDER — ETONOGESTREL-ETHINYL ESTRADIOL 0.12-0.015 MG/24HR VA RING: 1.0000 | VAGINAL_RING | VAGINAL | 4 refills | Status: DC

## 2018-11-13 NOTE — Progress Notes (Signed)
Erica Estes C Erica Estes 10-04-1981 [id number]   History:    37 y.o. G3P2A1L2 Married.  Boys 3 and 5+ yo.  RP:  Established patient presenting for annual gyn exam   HPI:  Mirena IUD with mild discomfort, would like to remove it.  Well on Nuvaring before, would like to restart on it.  May want to conceive in a year or two.  No BTB.  No pain with IC.  Breasts wnl.  Urine/BMs wnl.  BMI 23.49.  Walking.  Past medical history,surgical history, family history and social history were all reviewed and documented in the EPIC chart.  Gynecologic History Patient's last menstrual period was 10/21/2018. Contraception: IUD Last Pap: 2016. Results were: normal Last mammogram: Never  Bone Density: Never Colonoscopy: Never  Obstetric History OB History  Gravida Para Term Preterm AB Living  3 2 2  0 1 2  SAB TAB Ectopic Multiple Live Births  0 0 1 0 2    # Outcome Date GA Lbr Len/2nd Weight Sex Delivery Anes PTL Lv  3 Term 04/04/15 7624w5d 15:05 / 00:25 7 lb 15.7 oz (3.62 kg) M Vag-Spont EPI  LIV  2 Term 12/29/12 7479w3d 23:16 / 00:43 6 lb 15 oz (3.147 kg) M Vag-Spont EPI  LIV  1 Ectopic 2011             ROS: A ROS was performed and pertinent positives and negatives are included in the history.  GENERAL: No fevers or chills. HEENT: No change in vision, no earache, sore throat or sinus congestion. NECK: No pain or stiffness. CARDIOVASCULAR: No chest pain or pressure. No palpitations. PULMONARY: No shortness of breath, cough or wheeze. GASTROINTESTINAL: No abdominal pain, nausea, vomiting or diarrhea, melena or bright red blood per rectum. GENITOURINARY: No urinary frequency, urgency, hesitancy or dysuria. MUSCULOSKELETAL: No joint or muscle pain, no back pain, no recent trauma. DERMATOLOGIC: No rash, no itching, no lesions. ENDOCRINE: No polyuria, polydipsia, no heat or cold intolerance. No recent change in weight. HEMATOLOGICAL: No anemia or easy bruising or bleeding. NEUROLOGIC: No headache, seizures, numbness,  tingling or weakness. PSYCHIATRIC: No depression, no loss of interest in normal activity or change in sleep pattern.     Exam:   BP 126/80 (BP Location: Right Arm, Patient Position: Sitting, Cuff Size: Normal)   Ht 5\' 7"  (1.702 m)   Wt 150 lb (68 kg)   LMP 10/21/2018   BMI 23.49 kg/m   Body mass index is 23.49 kg/m.  General appearance : Well developed well nourished female. No acute distress HEENT: Eyes: no retinal hemorrhage or exudates,  Neck supple, trachea midline, no carotid bruits, no thyroidmegaly Lungs: Clear to auscultation, no rhonchi or wheezes, or rib retractions  Heart: Regular rate and rhythm, no murmurs or gallops Breast:Examined in sitting and supine position were symmetrical in appearance, no palpable masses or tenderness,  no skin retraction, no nipple inversion, no nipple discharge, no skin discoloration, no axillary or supraclavicular lymphadenopathy Abdomen: no palpable masses or tenderness, no rebound or guarding Extremities: no edema or skin discoloration or tenderness  Pelvic: Vulva: Normal             Vagina: No gross lesions or discharge  Cervix: No gross lesions or discharge.  Pap reflex done.  IUD strings visible.  IUD removed easily by grasping the strings with a Boseman clamp and pulling.  IUD intact, complete.  Shown to patient and discarded.  Uterus  AV, normal size, shape and consistency, non-tender and mobile  Adnexa  Without masses or tenderness  Anus: Normal   Assessment/Plan:  37 y.o. female for annual exam   1. Well female exam with routine gynecological exam Normal gynecologic exam.  Pap reflex done.  Breast exam normal.  Good body mass index at 23.49.  Fit and healthy nutrition. - Pap IG w/ reflex to HPV when ASC-U  2. Encounter for IUD removal Desired removal of the Mirena IUD today.  Mirena IUD removed easily, intact and complete.  Shown to patient and discarded.  3. Encounter for initial prescription of vaginal ring hormonal  contraceptive Decision to start on NuvaRing.  No contraindication to estrogen/progestin contraception.  Used by patient before who knows the usage, risks and benefits.  Prescription sent to pharmacy.  Other orders - etonogestrel-ethinyl estradiol (NUVARING) 0.12-0.015 MG/24HR vaginal ring; Place 1 each vaginally every 28 (twenty-eight) days. Insert vaginally and leave in place for 3 consecutive weeks, then remove for 1 week.  Genia Del MD, 3:50 PM 11/13/2018

## 2018-11-14 ENCOUNTER — Encounter: Payer: Self-pay | Admitting: Obstetrics & Gynecology

## 2018-11-14 NOTE — Patient Instructions (Signed)
1. Well female exam with routine gynecological exam Normal gynecologic exam.  Pap reflex done.  Breast exam normal.  Good body mass index at 23.49.  Fit and healthy nutrition. - Pap IG w/ reflex to HPV when ASC-U  2. Encounter for IUD removal Desired removal of the Mirena IUD today.  Mirena IUD removed easily, intact and complete.  Shown to patient and discarded.  3. Encounter for initial prescription of vaginal ring hormonal contraceptive Decision to start on NuvaRing.  No contraindication to estrogen/progestin contraception.  Used by patient before who knows the usage, risks and benefits.  Prescription sent to pharmacy.  Other orders - etonogestrel-ethinyl estradiol (NUVARING) 0.12-0.015 MG/24HR vaginal ring; Place 1 each vaginally every 28 (twenty-eight) days. Insert vaginally and leave in place for 3 consecutive weeks, then remove for 1 week.  Erica Estes, it was a pleasure seeing you today!  I will inform you of your results as soon as they are available.

## 2018-11-16 ENCOUNTER — Telehealth: Payer: Self-pay | Admitting: *Deleted

## 2018-11-16 LAB — PAP IG W/ RFLX HPV ASCU

## 2018-11-16 NOTE — Telephone Encounter (Signed)
-----   Message from Genia DelMarie-Lyne Lavoie, MD sent at 11/13/2018  3:59 PM EST ----- Regarding: Please [facility] at [facility].

## 2018-11-16 NOTE — Telephone Encounter (Signed)
Called [facility] and canceled Rx.

## 2019-10-04 ENCOUNTER — Other Ambulatory Visit: Payer: Self-pay

## 2019-10-05 ENCOUNTER — Other Ambulatory Visit: Payer: Self-pay

## 2019-10-05 ENCOUNTER — Encounter: Payer: Self-pay | Admitting: Obstetrics & Gynecology

## 2019-10-05 ENCOUNTER — Ambulatory Visit: Payer: Managed Care, Other (non HMO) | Admitting: Obstetrics & Gynecology

## 2019-10-05 VITALS — BP 126/78

## 2019-10-05 DIAGNOSIS — Z8759 Personal history of other complications of pregnancy, childbirth and the puerperium: Secondary | ICD-10-CM

## 2019-10-05 DIAGNOSIS — Z3200 Encounter for pregnancy test, result unknown: Secondary | ICD-10-CM

## 2019-10-05 DIAGNOSIS — Z3201 Encounter for pregnancy test, result positive: Secondary | ICD-10-CM | POA: Diagnosis not present

## 2019-10-05 DIAGNOSIS — Z23 Encounter for immunization: Secondary | ICD-10-CM

## 2019-10-05 LAB — PREGNANCY, URINE: Preg Test, Ur: POSITIVE — AB

## 2019-10-05 NOTE — Progress Notes (Signed)
    Erica Estes 1981/02/04 [id number]        38 y.o.  [id number]  Married.  Has 2 boys.    RP: HPT positive for pregnancy confirmation  HPI: LMP 09/03/2019.  Probably ovulated on 9/14th.  Mild spotting on 9/24th, none since then.  No pelvic pain.  Probable early ectopic pregnancy, but not confirm as she had spontaneous resolution without treatment.  2 normal pregnancies after that.  Healthy nutrition and fit.  Taking PNVs.   OB History  Gravida Para Term Preterm AB Living  3 2 2  0 1 2  SAB TAB Ectopic Multiple Live Births  0 0 1 0 2    # Outcome Date GA Lbr Len/2nd Weight Sex Delivery Anes PTL Lv  3 Term 04/04/15 [redacted]w[redacted]d 15:05 / 00:25 7 lb 15.7 oz (3.62 kg) M Vag-Spont EPI  LIV  2 Term 12/29/12 [redacted]w[redacted]d 23:16 / 00:43 6 lb 15 oz (3.147 kg) M Vag-Spont EPI  LIV  1 Ectopic 2011            Past medical history,surgical history, problem list, medications, allergies, family history and social history were all reviewed and documented in the EPIC chart.   Directed ROS with pertinent positives and negatives documented in the history of present illness/assessment and plan.  Exam:  Vitals:   10/05/19 1446  BP: 126/78   General appearance:  Normal  Abdomen: Normal  Gynecologic exam: Vulva normal.  Bimanual exam:  AV uterus, normal volume, NT, mobile.  Cervix is long, closed, firm.  No adnexal mass, NT.  No bleeding, normal secretions.    UPT positive   Assessment/Plan:  38 y.o. A6T0160   1. Encounter for confirmation of pregnancy test result with physical examination Early pregnancy at 4 weeks and 4 days per last menstrual.  September 4.  Expected date of delivery June 08, 2020.  Could be a few days more given early ovulations usually.  Urine pregnancy test positive today.  Normal gynecologic exam.  History of ectopic pregnancy which resolved spontaneously and 2 normal term pregnancies after that.  We will follow-up at 6 to 7 weeks for an OB ultrasound to confirm an intrauterine pregnancy,  dating and viability.  Continue with regular physical activities, healthy nutrition and prenatal vitamins. - Pregnancy, urine - US OB Transvaginal; Future  2. Hx of ectopic pregnancy Will confirm an intrauterine pregnancy with an OB ultrasound at follow-up.  Other orders - Docosahexaenoic Acid (PRENATAL DHA) 200 MG CAPS; Take by mouth.  Counseling on above issues and coordination of care more than 50% for 15 minutes.  Princess Bruins MD, 2:59 PM 10/05/2019

## 2019-10-08 ENCOUNTER — Encounter: Payer: Self-pay | Admitting: Obstetrics & Gynecology

## 2019-10-08 NOTE — Patient Instructions (Addendum)
1. Encounter for confirmation of pregnancy test result with physical examination Early pregnancy at 4 weeks and 4 days per last menstrual.  September 4.  Expected date of delivery June 08, 2020.  Could be a few days more given early ovulations usually.  Urine pregnancy test positive today.  Normal gynecologic exam.  History of ectopic pregnancy which resolved spontaneously and 2 normal term pregnancies after that.  We will follow-up at 6 to 7 weeks for an OB ultrasound to confirm an intrauterine pregnancy, dating and viability.  Continue with regular physical activities, healthy nutrition and prenatal vitamins. - Pregnancy, urine - US OB Transvaginal; Future  2. Hx of ectopic pregnancy Will confirm an intrauterine pregnancy with an OB ultrasound at follow-up.  Other orders - Docosahexaenoic Acid (PRENATAL DHA) 200 MG CAPS; Take by mouth.  Armenta, it was a pleasure seeing you today!

## 2019-10-18 ENCOUNTER — Other Ambulatory Visit: Payer: Self-pay

## 2019-10-19 ENCOUNTER — Encounter: Payer: Self-pay | Admitting: Obstetrics & Gynecology

## 2019-10-19 ENCOUNTER — Other Ambulatory Visit: Payer: Self-pay

## 2019-10-19 ENCOUNTER — Inpatient Hospital Stay (HOSPITAL_COMMUNITY)
Admission: AD | Admit: 2019-10-19 | Discharge: 2019-10-19 | Disposition: A | Discharge status: Home / Self Care | Payer: Managed Care, Other (non HMO) | Attending: Obstetrics and Gynecology | Admitting: Obstetrics and Gynecology

## 2019-10-19 ENCOUNTER — Ambulatory Visit: Payer: Managed Care, Other (non HMO) | Admitting: Obstetrics & Gynecology

## 2019-10-19 ENCOUNTER — Ambulatory Visit (INDEPENDENT_AMBULATORY_CARE_PROVIDER_SITE_OTHER): Payer: Managed Care, Other (non HMO)

## 2019-10-19 DIAGNOSIS — O039 Complete or unspecified spontaneous abortion without complication: Secondary | ICD-10-CM | POA: Diagnosis not present

## 2019-10-19 DIAGNOSIS — Z3A01 Less than 8 weeks gestation of pregnancy: Secondary | ICD-10-CM

## 2019-10-19 DIAGNOSIS — O020 Blighted ovum and nonhydatidiform mole: Secondary | ICD-10-CM | POA: Diagnosis not present

## 2019-10-19 DIAGNOSIS — Z3200 Encounter for pregnancy test, result unknown: Secondary | ICD-10-CM

## 2019-10-19 DIAGNOSIS — N854 Malposition of uterus: Secondary | ICD-10-CM

## 2019-10-19 MED ORDER — RHO D IMMUNE GLOBULIN 1500 UNIT/2ML IJ SOSY
300.0000 ug | PREFILLED_SYRINGE | Freq: Once | INTRAMUSCULAR | Status: AC
  Administered 2019-10-19: 300 ug via INTRAMUSCULAR
  Filled 2019-10-19: qty 2

## 2019-10-19 NOTE — Discharge Instructions (Signed)
h InRcompatibility Rh incompatibility is a condition that occurs during pregnancy if a woman has Rh-negative blood and her baby has Rh-positive blood. "Rh-negative" and "Rh-positive" refer to whether or not the blood has a specific protein found on the surface of red blood cells (Rh factor). If a woman has Rh factor, she is Rh-positive. If she does not have an Rh factor, she is Rh-negative. Having or not having an Rh factor does not affect the mothers general health. However, it can cause problems during pregnancy. What kind of problems can Rh incompatibility cause? During pregnancy, blood from the baby can cross into the mothers bloodstream, especially during delivery. If a mother is Rh-negative and the baby is Rh-positive, the mothers defense (immune) system will react to the baby's blood as if it were a foreign substance and will create certain proteins (antibodies) in response to it. This process is called sensitization. Once the mother is sensitized, her Rh antibodies will cross the placenta in future pregnancies and attack the babys Rh-positive blood as if it were a harmful substance. Rh incompatibility can also happen if a pregnant woman who is Rh-negative receives a donation (transfusion) of Rh-positive blood. How does this condition affect my baby? The Rh antibodies that attack and destroy your babys red blood cells can lead to hemolytic disease in the baby. This is a condition in which red blood cells break down. This can cause:  Yellowing of the skin and the whites of the eyes (jaundice).  Fewer red blood cells in the body (anemia).  Brain damage.  Heart failure.  Death. These antibodies usually do not cause problems during a woman's first pregnancy. This is because blood from the baby often crosses into the mothers bloodstream during delivery, and the baby is born before many of the antibodies can develop. However, once antibodies have formed, they stay in a woman's body. Because  of this, Rh incompatibility is more likely to cause problems in second or later pregnancies (if the baby is Rh-positive). How is this diagnosed?  When you become pregnant, you may have blood tests to determine your blood type and Rh factor. If you are Rh-negative, you may have another blood test called an antibody screen. The antibody screen shows whether you have Rh antibodies in your blood. If you do, it may mean that you have been exposed to Rh-positive blood before, and that you have a risk for Rh incompatibility. To find out whether your baby is developing hemolytic anemia and how serious it is, health care providers may use more advanced tests, such as ultrasound. How is Rh incompatibility treated? This condition is treated with two shots (injections) of medicine called Rho (D) immune globulin. This medicine keeps your body from making antibodies that can cause serious problems for your baby or for future babies. You will get one shot around your seventh month of pregnancy and the other within 72 hours of your baby's birth. If you are Rh-negative, you will need this medicine every time you have a baby with Rh-positive blood. If you are Rh-negative and there is a high risk of blood transfer between you and your baby, you may be given Rho (D) immune globulin. The risk of blood transfer is high if you experience:  Amniocentesis. This is a procedure to remove a small amount of the fluid that surrounds a baby in the uterus (amniotic fluid) so that it can be tested.  A miscarriage or an abortion.  An ectopic pregnancy. This is a pregnancy in  which the fertilized egg attaches (implants) outside the uterus.  Any vaginal bleeding during pregnancy. If you already have antibodies in your blood, your pregnancy will be closely monitored. You will not be given Rho (D) immune globulin because it is not effective in these cases. Summary  Rh incompatibility is a condition that occurs during pregnancy if a  woman has Rh-negative blood and her baby has Rh-positive blood.  If a mother is Rh-negative and the baby is Rh-positive, the mothers immune system will react to the baby's blood as if it were a foreign substance, and will create antibodies.  Rh antibodies that attack and destroy the babys red blood cells can lead to hemolytic disease in the baby.  This condition is treated with a shot of medicine called Rho (D) immune globulin. This medicine keeps the woman's body from making antibodies that can cause serious problems in the baby or future babies. This information is not intended to replace advice given to you by your health care provider. Make sure you discuss any questions you have with your health care provider. Document Released: 06/07/2002 Document Revised: 11/28/2017 Document Reviewed: 02/11/2017 Elsevier Patient Education  2020 [employer].

## 2019-10-19 NOTE — MAU Note (Signed)
Sent from office, post SAB, needing rhophyllac injection.

## 2019-10-19 NOTE — MAU Note (Signed)
Time associated with blood draw and available injection explained.  Pt verbalized understanding.

## 2019-10-19 NOTE — Progress Notes (Signed)
    Erica Estes [birth year] [id number]        38 y.o.  [id number] Married.  Per LMP 09/03/2019 at 6 wks 4/7.  O Negative.  RP: First trimester [place] for viability and dating  HPI:  Had spotting on 09/23/2019.  No vaginal bleeding since then.  No pelvic pain.  Probable h/o ectopic with spontaneous resolution, 2 normal term pregnancies after that.   OB History  Gravida Para Term Preterm AB Living  3 2 2  0 1 2  SAB TAB Ectopic Multiple Live Births  0 0 1 0 2    # Outcome Date GA Lbr Len/2nd Weight Sex Delivery Anes PTL Lv  3 Term 04/04/15 [redacted]w[redacted]d 15:05 / 00:25 7 lb 15.7 oz (3.62 kg) M Vag-Spont EPI  LIV  2 Term 12/29/12 [redacted]w[redacted]d 23:16 / 00:43 6 lb 15 oz (3.147 kg) M Vag-Spont EPI  LIV  1 Ectopic 2011            Past medical history,surgical history, problem list, medications, allergies, family history and social history were all reviewed and documented in the EPIC chart.   Directed ROS with pertinent positives and negatives documented in the history of present illness/assessment and plan.  Exam:  There were no vitals filed for this visit. General appearance:  Normal  Ob US today: T/V images.  Retroverted uterus with no myometrial mass.  The endometrial cavity is dilated to 1.7 cm with fluid and debris's.  No gestational sac is visible.  Ovaries normal with a 2.4 cm corpus luteum on the right.  No adnexal mass.  No free fluid in the posterior cul-de-sac.  Nontender vaginal exam.   Assessment/Plan:  38 y.o. [id number]   1. Spontaneous abortion in first trimester Probable early complete spontaneous abortion.  No evidence of ectopic pregnancy clinically with no pelvic pain and no adnexal mass seen.  Probable intra uterine pregnancy with spontaneous abortion given the fluid and debris's in the intrauterine cavity.  Patient will have a quant beta hCG today and a repeat in 2 days to make sure that they are decreasing appropriately.  We will continue to check quant beta hCG until negative.  Blood group  and Rh are both negative.  Will verify antibody screen now.  Patient will [facility] today.  Ectopic precautions reviewed.  Information and support given to patient and husband who accompanied the patient today. - B-HCG Quant; Future - B-HCG Quant - ABO AND RH  - Antibody screen  Counseling on above issues and coordination of care more than 50% for 15 minutes.  Princess Bruins MD, 9:51 AM 10/19/2019

## 2019-10-19 NOTE — Patient Instructions (Signed)
1. Spontaneous abortion in first trimester Probable early complete spontaneous abortion.  No evidence of ectopic pregnancy clinically with no pelvic pain and no adnexal mass seen.  Probable intra uterine pregnancy with spontaneous abortion given the fluid and debris's in the intrauterine cavity.  Patient will have a quant beta hCG today and a repeat in 2 days to make sure that they are decreasing appropriately.  We will continue to check quant beta hCG until negative.  Blood group and Rh are both negative.  Will verify antibody screen now.  Patient will receive Rh[facility]ylac at [facility] [place] emergency room today.  Ectopic precautions reviewed.  Information and support given to patient and husband who accompanied the patient today. - B-HCG Quant; Future - B-HCG Quant - ABO AND RH  - Antibody screen  Erica Estes, sorry about your loss.  I will inform you of your results and expect you to call me if you develop any problem.Marland Kitchen

## 2019-10-20 ENCOUNTER — Emergency Department (HOSPITAL_COMMUNITY): Payer: Managed Care, Other (non HMO) | Admitting: Anesthesiology

## 2019-10-20 ENCOUNTER — Encounter (HOSPITAL_COMMUNITY)
Admission: EM | Disposition: A | Discharge status: Home / Self Care | Payer: Self-pay | Source: Home / Self Care | Attending: Emergency Medicine

## 2019-10-20 ENCOUNTER — Other Ambulatory Visit: Payer: Self-pay

## 2019-10-20 ENCOUNTER — Emergency Department (HOSPITAL_COMMUNITY): Payer: Managed Care, Other (non HMO)

## 2019-10-20 ENCOUNTER — Telehealth: Payer: Self-pay

## 2019-10-20 ENCOUNTER — Emergency Department (HOSPITAL_COMMUNITY)
Admission: EM | Admit: 2019-10-20 | Discharge: 2019-10-20 | Disposition: A | Discharge status: Home / Self Care | Payer: Managed Care, Other (non HMO) | Attending: Emergency Medicine | Admitting: Emergency Medicine

## 2019-10-20 ENCOUNTER — Encounter (HOSPITAL_COMMUNITY): Payer: Self-pay | Admitting: *Deleted

## 2019-10-20 DIAGNOSIS — Z20828 Contact with and (suspected) exposure to other viral communicable diseases: Secondary | ICD-10-CM | POA: Diagnosis not present

## 2019-10-20 DIAGNOSIS — Z3A01 Less than 8 weeks gestation of pregnancy: Secondary | ICD-10-CM | POA: Insufficient documentation

## 2019-10-20 DIAGNOSIS — O021 Missed abortion: Secondary | ICD-10-CM

## 2019-10-20 DIAGNOSIS — O020 Blighted ovum and nonhydatidiform mole: Secondary | ICD-10-CM

## 2019-10-20 DIAGNOSIS — O009 Unspecified ectopic pregnancy without intrauterine pregnancy: Secondary | ICD-10-CM | POA: Diagnosis present

## 2019-10-20 HISTORY — PX: DIAGNOSTIC LAPAROSCOPY WITH REMOVAL OF ECTOPIC PREGNANCY: SHX6449

## 2019-10-20 LAB — CBC WITH DIFFERENTIAL/PLATELET
Abs Immature Granulocytes: 0.05 10*3/uL (ref 0.00–0.07)
Basophils Absolute: 0.1 10*3/uL (ref 0.0–0.1)
Basophils Relative: 1 %
Eosinophils Absolute: 0.1 10*3/uL (ref 0.0–0.5)
Eosinophils Relative: 1 %
HCT: 42.5 % (ref 36.0–46.0)
Hemoglobin: 14.4 g/dL (ref 12.0–15.0)
Immature Granulocytes: 1 %
Lymphocytes Relative: 24 %
Lymphs Abs: 2.6 10*3/uL (ref 0.7–4.0)
MCH: 31.1 pg (ref 26.0–34.0)
MCHC: 33.9 g/dL (ref 30.0–36.0)
MCV: 91.8 fL (ref 80.0–100.0)
Monocytes Absolute: 0.8 10*3/uL (ref 0.1–1.0)
Monocytes Relative: 7 %
Neutro Abs: 7.1 10*3/uL (ref 1.7–7.7)
Neutrophils Relative %: 66 %
Platelets: 289 10*3/uL (ref 150–400)
RBC: 4.63 MIL/uL (ref 3.87–5.11)
RDW: 11.8 % (ref 11.5–15.5)
WBC: 10.6 10*3/uL — ABNORMAL HIGH (ref 4.0–10.5)
nRBC: 0 % (ref 0.0–0.2)

## 2019-10-20 LAB — RH IG WORKUP (INCLUDES ABO/RH)
ABO/RH(D): O NEG
Antibody Screen: NEGATIVE
Gestational Age(Wks): 6
Unit division: 0

## 2019-10-20 LAB — BASIC METABOLIC PANEL
Anion gap: 12 (ref 5–15)
BUN: 13 mg/dL (ref 6–20)
CO2: 21 mmol/L — ABNORMAL LOW (ref 22–32)
Calcium: 10.3 mg/dL (ref 8.9–10.3)
Chloride: 103 mmol/L (ref 98–111)
Creatinine, Ser: 0.7 mg/dL (ref 0.44–1.00)
GFR calc Af Amer: >60 mL/min (ref >60)
GFR calc non Af Amer: >60 mL/min (ref >60)
Glucose, Bld: 86 mg/dL (ref 70–99)
Potassium: 3.2 mmol/L — ABNORMAL LOW (ref 3.5–5.1)
Sodium: 136 mmol/L (ref 135–145)

## 2019-10-20 LAB — HCG, QUANTITATIVE, PREGNANCY
HCG, Total, QN: 80169 m[IU]/mL
hCG, Beta Chain, Quant, S: 109594 m[IU]/mL — ABNORMAL HIGH (ref <5)

## 2019-10-20 LAB — SARS CORONAVIRUS 2 BY RT PCR (HOSPITAL ORDER, PERFORMED IN ~~LOC~~ HOSPITAL LAB): SARS Coronavirus 2: NEGATIVE

## 2019-10-20 LAB — ANTIBODY SCREEN: Antibody Screen: NOT DETECTED

## 2019-10-20 LAB — ABO AND RH

## 2019-10-20 SURGERY — LAPAROSCOPY, WITH ECTOPIC PREGNANCY SURGICAL TREATMENT
Anesthesia: General | Site: Uterus

## 2019-10-20 MED ORDER — SILVER NITRATE-POT NITRATE 75-25 % EX MISC
CUTANEOUS | Status: DC | PRN
  Administered 2019-10-20: 2 via TOPICAL

## 2019-10-20 MED ORDER — LIDOCAINE 2% (20 MG/ML) 5 ML SYRINGE
INTRAMUSCULAR | Status: DC | PRN
  Administered 2019-10-20: 80 mg via INTRAVENOUS

## 2019-10-20 MED ORDER — LACTATED RINGERS IV SOLN
INTRAVENOUS | Status: DC | PRN
  Administered 2019-10-20 (×2): via INTRAVENOUS

## 2019-10-20 MED ORDER — FENTANYL CITRATE (PF) 100 MCG/2ML IJ SOLN: 25.0000 ug | INTRAMUSCULAR | Status: DC | PRN

## 2019-10-20 MED ORDER — LACTATED RINGERS IV SOLN
INTRAVENOUS | Status: DC
  Administered 2019-10-20: 19:00:00 via INTRAVENOUS

## 2019-10-20 MED ORDER — 0.9 % SODIUM CHLORIDE (POUR BTL) OPTIME
TOPICAL | Status: DC | PRN
  Administered 2019-10-20: 20:00:00 1000 mL

## 2019-10-20 MED ORDER — OXYTOCIN 10 UNIT/ML IJ SOLN
10.0000 [IU] | Freq: Once | INTRAMUSCULAR | Status: DC
  Filled 2019-10-20: qty 1

## 2019-10-20 MED ORDER — SILVER NITRATE-POT NITRATE 75-25 % EX MISC
CUTANEOUS | Status: AC
  Filled 2019-10-20: qty 1

## 2019-10-20 MED ORDER — SCOPOLAMINE 1 MG/3DAYS TD PT72
MEDICATED_PATCH | TRANSDERMAL | Status: DC | PRN
  Administered 2019-10-20: 1 via TRANSDERMAL

## 2019-10-20 MED ORDER — SCOPOLAMINE 1 MG/3DAYS TD PT72
MEDICATED_PATCH | TRANSDERMAL | Status: AC
  Filled 2019-10-20: qty 1

## 2019-10-20 MED ORDER — BUPIVACAINE HCL (PF) 0.25 % IJ SOLN
INTRAMUSCULAR | Status: AC
  Filled 2019-10-20: qty 30

## 2019-10-20 MED ORDER — LIDOCAINE HCL 1 % IJ SOLN
INTRAMUSCULAR | Status: DC | PRN
  Administered 2019-10-20: 20 mL

## 2019-10-20 MED ORDER — DEXAMETHASONE SODIUM PHOSPHATE 10 MG/ML IJ SOLN
INTRAMUSCULAR | Status: DC | PRN
  Administered 2019-10-20: 10 mg via INTRAVENOUS

## 2019-10-20 MED ORDER — PROPOFOL 10 MG/ML IV BOLUS
INTRAVENOUS | Status: AC
  Filled 2019-10-20: qty 20

## 2019-10-20 MED ORDER — MIDAZOLAM HCL 2 MG/2ML IJ SOLN
INTRAMUSCULAR | Status: AC
  Filled 2019-10-20: qty 2

## 2019-10-20 MED ORDER — FENTANYL CITRATE (PF) 250 MCG/5ML IJ SOLN
INTRAMUSCULAR | Status: AC
  Filled 2019-10-20: qty 5

## 2019-10-20 MED ORDER — FENTANYL CITRATE (PF) 100 MCG/2ML IJ SOLN
INTRAMUSCULAR | Status: DC | PRN
  Administered 2019-10-20 (×2): 50 ug via INTRAVENOUS

## 2019-10-20 MED ORDER — ONDANSETRON HCL 4 MG/2ML IJ SOLN
INTRAMUSCULAR | Status: DC | PRN
  Administered 2019-10-20: 4 mg via INTRAVENOUS

## 2019-10-20 MED ORDER — LIDOCAINE HCL (PF) 1 % IJ SOLN
INTRAMUSCULAR | Status: AC
  Filled 2019-10-20: qty 30

## 2019-10-20 MED ORDER — PROPOFOL 10 MG/ML IV BOLUS
INTRAVENOUS | Status: DC | PRN
  Administered 2019-10-20: 180 mg via INTRAVENOUS

## 2019-10-20 MED ORDER — ACETAMINOPHEN 10 MG/ML IV SOLN
INTRAVENOUS | Status: DC | PRN
  Administered 2019-10-20: 1000 mg via INTRAVENOUS

## 2019-10-20 MED ORDER — ACETAMINOPHEN 10 MG/ML IV SOLN
INTRAVENOUS | Status: AC
  Filled 2019-10-20: qty 100

## 2019-10-20 MED ORDER — SUCCINYLCHOLINE CHLORIDE 200 MG/10ML IV SOSY
PREFILLED_SYRINGE | INTRAVENOUS | Status: DC | PRN
  Administered 2019-10-20: 100 mg via INTRAVENOUS

## 2019-10-20 MED ORDER — CEFAZOLIN SODIUM-DEXTROSE 2-4 GM/100ML-% IV SOLN
INTRAVENOUS | Status: AC
  Filled 2019-10-20: qty 100

## 2019-10-20 MED ORDER — PROMETHAZINE HCL 25 MG/ML IJ SOLN: 6.2500 mg | INTRAMUSCULAR | Status: DC | PRN

## 2019-10-20 SURGICAL SUPPLY — 14 items
DRAPE SHEET LG 3/4 BI-LAMINATE (DRAPES) ×2 IMPLANT
GOWN STRL REUS W/TWL LRG LVL3 (GOWN DISPOSABLE) ×4 IMPLANT
KIT TURNOVER KIT A (KITS) IMPLANT
LEGGING LITHOTOMY PAIR STRL (DRAPES) ×2 IMPLANT
MANIPULATOR UTERINE 4.5 ZUMI (MISCELLANEOUS) ×2 IMPLANT
NDL SPNL 22GX3.5 QUINCKE BK (NEEDLE) IMPLANT
NEEDLE SPNL 22GX3.5 QUINCKE BK (NEEDLE) ×3 IMPLANT
NS IRRIG 1000ML POUR BTL (IV SOLUTION) ×3 IMPLANT
PACK LAPAROSCOPY BASIN (CUSTOM PROCEDURE TRAY) ×3 IMPLANT
PROTECTOR NERVE ULNAR (MISCELLANEOUS) ×6 IMPLANT
SET BERKELEY SUCTION TUBING (SUCTIONS) ×2 IMPLANT
SUT VICRYL 0 TIES 12 18 (SUTURE) IMPLANT
TOWEL OR 17X26 10 PK STRL BLUE (TOWEL DISPOSABLE) ×6 IMPLANT
VACURETTE 12 RIGID CVD (CANNULA) ×2 IMPLANT

## 2019-10-20 NOTE — Anesthesia Procedure Notes (Signed)
Procedure Name: Intubation Performed by: Lissa Morales, CRNA Pre-anesthesia Checklist: Patient identified, Emergency Drugs available, Suction available and Patient being monitored Patient Re-evaluated:Patient Re-evaluated prior to induction Oxygen Delivery Method: Circle system utilized Preoxygenation: Pre-oxygenation with 100% oxygen Induction Type: IV induction Ventilation: Mask ventilation without difficulty Laryngoscope Size: Mac and 4 Grade View: Grade I Tube type: Oral Tube size: 7.0 mm Number of attempts: 1 Airway Equipment and Method: Stylet and Oral airway Placement Confirmation: ETT inserted through vocal cords under direct vision,  positive ETCO2 and breath sounds checked- equal and bilateral Secured at: 22 cm Tube secured with: Tape Dental Injury: Teeth and Oropharynx as per pre-operative assessment

## 2019-10-20 NOTE — Telephone Encounter (Signed)
Dr. Dellis Filbert came and spoke with me. Per her recommendation I spoke with patient and advised her to go urgently now to [facility] ER as Erica Estes was 80,000 yesterday and Dr. Dellis Filbert is concerned she might have an ectopic pregnancy.  I told her, per Dr. Marguerita Merles, that when she get there to have the hospital call Dr. Dellis Filbert.  She agreed.

## 2019-10-20 NOTE — Transfer of Care (Signed)
Immediate Anesthesia Transfer of Care Note  Patient: Erica Estes  Procedure(s) Performed: SUCTION DILATION AND EVACUATION WITH CERVICAL BLOCK (N/A Uterus)  Patient Location: PACU  Anesthesia Type:General  Level of Consciousness: awake, alert , oriented and patient cooperative  Airway & Oxygen Therapy: Patient Spontanous Breathing and Patient connected to face mask oxygen  Post-op Assessment: Report given to RN, Post -op Vital signs reviewed and stable and Patient moving all extremities X 4  Post vital signs: stable  Last Vitals:  Vitals Value Taken Time  BP 120/68 10/20/19 2015  Temp    Pulse 107 10/20/19 2016  Resp 22 10/20/19 2016  SpO2 100 % 10/20/19 2016  Vitals shown include unvalidated device data.  Last Pain:  Vitals:   10/20/19 1900  TempSrc:   PainSc: 2          Complications: No apparent anesthesia complications

## 2019-10-20 NOTE — ED Provider Notes (Signed)
[facility] DEPT Provider Note   CSN: [id number] Arrival date & time: 10/20/19  1706     History   Chief Complaint Chief Complaint  Patient presents with   ? Ectopic    HPI Erica Estes is a 38 y.o. female.     HPI  38 year old female who is G4, P2 presents with concern for ectopic pregnancy.  She has had 1 ectopic pregnancy before.  Is currently just under [redacted] weeks pregnant.  Went to her OB yesterday for an ultrasound given her previous ectopic history and despite a quant of 80,000 they could not find the fetus.  The patient developed some nausea and lightheadedness today so she was told to come to the ER for possible ectopic surgery.  Her OB is Dr. Dellis Filbert.  The patient feels some lower abdominal pressure but not really much pain.  No vaginal bleeding.  Past Medical History:  Diagnosis Date   Abnormal Pap smear    Compression fracture of T12 vertebra (HCC) 2000   Depression    Wellbutrin 150 mg daily   Ectopic pregnancy 2011   no tx needed, "It absorbed itself"   GERD (gastroesophageal reflux disease)    IBS (irritable bowel syndrome)    Postpartum care following vaginal delivery (4/4) 04/04/2015   Postpartum care following vaginal delivery (4/5) 04/04/2015   Vaginal Pap smear, abnormal     Patient Active Problem List   Diagnosis Date Noted   Rh negative status during pregnancy 04/05/2015   History of depression 04/05/2015   Postpartum care following vaginal delivery (4/5) 04/04/2015   Normal labor 04/03/2015    Past Surgical History:  Procedure Laterality Date   COLPOSCOPY  2005   LAPAROSCOPY  2000   TONSILLECTOMY  12/98     OB History    Gravida  3   Para  2   Term  2   Preterm  0   AB  1   Living  2     SAB  0   TAB  0   Ectopic  1   Multiple  0   Live Births  2            Home Medications    Prior to Admission medications   Medication Sig Start Date End Date Taking? Authorizing  Provider  Docosahexaenoic Acid (PRENATAL DHA) 200 MG CAPS Take by mouth.   Yes [provider]  famotidine (PEPCID) 20 MG tablet Take 20 mg by mouth 2 (two) times daily.   Yes [provider]    Family History Family History  Problem Relation Age of Onset   Hyperlipidemia Mother    Hypertension Mother        controlled by diet   Cancer Father 63       malignant   Hyperlipidemia Maternal Aunt    Depression Maternal Aunt    Bipolar disorder Maternal Aunt    Hypertension Maternal Aunt    Hypertension Maternal Grandfather    Heart disease Paternal Grandfather     Social History Social History   Tobacco Use   Smoking status: Never Smoker   Smokeless tobacco: Never Used  Substance Use Topics   Alcohol use: Yes    Comment: occasionallyLAST DRANK   MARCH   Drug use: No     Allergies   Patient has no known allergies.   Review of Systems Review of Systems  Gastrointestinal: Positive for abdominal pain and nausea. Negative for vomiting.  Genitourinary: Negative  for vaginal bleeding.  Neurological: Positive for light-headedness.  All other systems reviewed and are negative.    Physical Exam Updated Vital Signs BP (!) 156/93 (BP Location: Left Arm)    Pulse (!) 110    Temp 98.4 F (36.9 C) (Oral)    Resp 16    Ht 5\' 7"  (1.702 m)    Wt 65.8 kg    SpO2 100%    BMI 22.71 kg/m   Physical Exam Vitals signs and nursing note reviewed.  Constitutional:      Appearance: She is well-developed.  HENT:     Head: Normocephalic and atraumatic.     Right Ear: External ear normal.     Left Ear: External ear normal.     Nose: Nose normal.  Eyes:     General:        Right eye: No discharge.        Left eye: No discharge.  Cardiovascular:     Rate and Rhythm: Regular rhythm. Tachycardia present.     Heart sounds: Normal heart sounds.  Pulmonary:     Effort: Pulmonary effort is normal.     Breath sounds: Normal breath sounds.  Abdominal:      General: There is no distension.     Palpations: Abdomen is soft.     Tenderness: There is no abdominal tenderness.  Skin:    General: Skin is warm and dry.  Neurological:     Mental Status: She is alert.  Psychiatric:        Mood and Affect: Mood is not anxious.      ED Treatments / Results  Labs (all labs ordered are listed, but only abnormal results are displayed) Labs Reviewed  BASIC METABOLIC PANEL - Abnormal; Notable for the following components:      Result Value   Potassium 3.2 (*)    CO2 21 (*)    All other components within normal limits  CBC WITH DIFFERENTIAL/PLATELET - Abnormal; Notable for the following components:   WBC 10.6 (*)    All other components within normal limits  HCG, QUANTITATIVE, PREGNANCY - Abnormal; Notable for the following components:   hCG, Beta Chain, Quant, S 109,594 (*)    All other components within normal limits  SARS CORONAVIRUS 2 BY RT PCR (HOSPITAL ORDER, PERFORMED IN [facility])  TYPE AND SCREEN  ABO/RH    EKG None  Radiology [place] Ob Comp < 14 Wks  Result Date: 10/20/2019 CLINICAL DATA:  Severe abdominal pain.  Nausea, and dizziness. EXAM: OBSTETRIC <14 WK US AND TRANSVAGINAL OB US TECHNIQUE: Both transabdominal and transvaginal ultrasound examinations were performed for complete evaluation of the gestation as well as the maternal uterus, adnexal regions, and pelvic cul-de-sac. Transvaginal technique was performed to assess early pregnancy. COMPARISON:  None. FINDINGS: Intrauterine gestational sac: None Maternal uterus/adnexae: The endometrial cavity is distended by heterogeneous material which shows internal cystic foci as well as internal blood flow on color Doppler ultrasound. This is suspicious for gestational trophoblastic neoplasia. No fibroids identified. The right ovary contains a small corpus luteum cyst. The left ovary is normal appearance. No adnexal mass or abnormal free fluid identified. IMPRESSION: No  intrauterine gestational sac or adnexal mass identified. Large amount of heterogeneous vascular soft tissue density within the endometrial cavity, highly suspicious for molar pregnancy. Electronically Signed   By: Danae OrleansJohn A Stahl M.D.   On: 10/20/2019 19:10   [place] Ob Transvaginal  Result Date: 10/20/2019 CLINICAL DATA:  Severe abdominal pain.  Nausea, and dizziness. EXAM: OBSTETRIC <14 WK [place] AND TRANSVAGINAL OB US TECHNIQUE: Both transabdominal and transvaginal ultrasound examinations were performed for complete evaluation of the gestation as well as the maternal uterus, adnexal regions, and pelvic cul-de-sac. Transvaginal technique was performed to assess early pregnancy. COMPARISON:  None. FINDINGS: Intrauterine gestational sac: None Maternal uterus/adnexae: The endometrial cavity is distended by heterogeneous material which shows internal cystic foci as well as internal blood flow on color Doppler ultrasound. This is suspicious for gestational trophoblastic neoplasia. No fibroids identified. The right ovary contains a small corpus luteum cyst. The left ovary is normal appearance. No adnexal mass or abnormal free fluid identified. IMPRESSION: No intrauterine gestational sac or adnexal mass identified. Large amount of heterogeneous vascular soft tissue density within the endometrial cavity, highly suspicious for molar pregnancy. Electronically Signed   By: Danae Orleans M.D.   On: 10/20/2019 19:10   US Ob Transvaginal  Result Date: 10/20/2019 T/V images.  Retroverted uterus with no myometrial mass.  The endometrial cavity is dilated to 1.7 cm with fluid and debris's.  No gestational sac is visible.  Ovaries normal with a 2.4 cm corpus luteum on the right.  No adnexal mass.  No free fluid in the posterior cul-de-sac.  Nontender vaginal exam.     Procedures Ultrasound ED FAST  Date/Time: 10/20/2019 5:45 PM Performed by: Pricilla Loveless, MD Authorized by: Pricilla Loveless, MD  Procedure details:     Indications comment:  Possible ruptured ectopic pregnancy    Assess for:  Intra-abdominal fluid    Technique:  Abdominal    Images: archived    Study Limitations: bowel gas  Abdominal findings:    L kidney:  Visualized   R kidney:  Visualized   Liver:  Visualized   Bladder:  Visualized,    Hepatorenal space visualized: identified     Splenorenal space: identified     Rectovesical free fluid: not identified     Splenorenal free fluid: not identified     Hepatorenal space free fluid: not identified   Comments:     No obvious free fluid.  Unable to see a clear ectopic pregnancy or intrauterine pregnancy.   (including critical care time)  Medications Ordered in ED Medications  lactated ringers infusion ( Intravenous New Bag/Given 10/20/19 1905)  propofol (DIPRIVAN) 10 mg/mL bolus/IV push (has no administration in time range)  midazolam (VERSED) 2 MG/2ML injection (has no administration in time range)  fentaNYL (SUBLIMAZE) 250 MCG/5ML injection (has no administration in time range)  bupivacaine (PF) (MARCAINE) 0.25 % injection (has no administration in time range)     Initial Impression / Assessment and Plan / ED Course  I have reviewed the triage vital signs and the nursing notes.  Pertinent labs & imaging results that were available during my care of the patient were reviewed by me and considered in my medical decision making (see chart for details).        Discussed with patient's OB/GYN, Dr. Seymour Bars, shortly after arrival.  I did a bedside ultrasound that did not show any obvious free fluid that would be concerning for rupture.  She was initially tachycardic but this improved.  Ultrasound is concerning for molar pregnancy.  Patient will be taken to the OR by OB/GYN.  Final Clinical Impressions(s) / ED Diagnoses   Final diagnoses:  Molar pregnancy    ED Discharge Orders    None       Pricilla Loveless, MD 10/20/19 [facility]

## 2019-10-20 NOTE — ED Notes (Signed)
Order was placed for pitocin. Dr. Dellis Filbert gave writer verbal order to discontinue pitocin order. Dr. Lenon Ahmadi she will not use pitocin for this procedure due to patient being in first trimester. Pharmacy notified.

## 2019-10-20 NOTE — Anesthesia Preprocedure Evaluation (Signed)
Anesthesia Evaluation  Patient identified by MRN, date of birth, ID band Patient awake    Reviewed: Allergy & Precautions, NPO status , Patient's Chart, lab work & pertinent test results  Airway Mallampati: II  TM Distance: >3 FB Neck ROM: Full    Dental  (+) Teeth Intact, Dental Advisory Given   Pulmonary neg pulmonary ROS,    Pulmonary exam normal breath sounds clear to auscultation       Cardiovascular negative cardio ROS Normal cardiovascular exam Rhythm:Regular Rate:Normal     Neuro/Psych PSYCHIATRIC DISORDERS Depression negative neurological ROS     GI/Hepatic Neg liver ROS, GERD  Medicated,  Endo/Other  negative endocrine ROS  Renal/GU negative Renal ROS     Musculoskeletal negative musculoskeletal ROS (+)   Abdominal   Peds  Hematology negative hematology ROS (+)   Anesthesia Other Findings Day of surgery medications reviewed with the patient.  Reproductive/Obstetrics ECTOPIC PREGNANCY                             Anesthesia Physical Anesthesia Plan  ASA: II and emergent  Anesthesia Plan: General   Post-op Pain Management:    Induction: Intravenous  PONV Risk Score and Plan: 4 or greater and Midazolam, Dexamethasone, Ondansetron and Diphenhydramine  Airway Management Planned: Oral ETT  Additional Equipment:   Intra-op Plan:   Post-operative Plan: Extubation in OR  Informed Consent: I have reviewed the patients History and Physical, chart, labs and discussed the procedure including the risks, benefits and alternatives for the proposed anesthesia with the patient or authorized representative who has indicated his/her understanding and acceptance.     Dental advisory given  Plan Discussed with: CRNA  Anesthesia Plan Comments:         Anesthesia Quick Evaluation

## 2019-10-20 NOTE — ED Triage Notes (Signed)
Pt presents with ? Ectopic, 6 weeks 6 days. Pt with nausea and dizziness when standing or walking. No vaginal bleeding. She has had one ectopic and 2 live births prior to this.

## 2019-10-20 NOTE — Op Note (Signed)
Operative Note  10/20/2019  8:09 PM  PATIENT:  Erica Estes  38 y.o. female  PRE-OPERATIVE DIAGNOSIS:  PROBABLE MOLAR PREGNANCY  POST-OPERATIVE DIAGNOSIS:  PROBABLE MOLAR PREGNANCY  PROCEDURE:  Procedure(s): DILATION AND SUCTION EVACUATION AND CURETTAGE WITH CERVICAL BLOCK  SURGEON:  Surgeon(s): Princess Bruins, MD  ANESTHESIA:   general  FINDINGS: Moderate amount of Products of Conception c/w a Molar Pregnancy  DESCRIPTION OF OPERATION: Under general anesthesia with laryngeal mask the patient is in lithotomy position.  She is prepped with Betadine on the suprapubic, vulvar and vaginal areas.  She is draped as usual.  The bladder is catheterized.  Timeout is done.  The vaginal exam reveals a retroverted enlarged uterus corresponding to about 9 weeks, mobile.  No adnexal mass felt.  The cervix is long and closed.  No vaginal bleeding. The speculum is inserted in the vagina.  The anterior lip of the cervix is grasped with a tenaculum.  A paracervical block is done with lidocaine 1% a total of 20 cc at 4 and 8:00.  Dilation of the cervix with Pratt dilators up to #35 without difficulty.  A #12 suction curette is used.  Suction and evacuation of the intra uterine cavity.  Moderate amount of products of conception is evacuated.  We then used a sharp curette to gently curettage all intrauterine surfaces.  The suction curette is reinserted in the intrauterine cavity and no further products of conception is present.  The uterus contracts well on the curette.  The suction curette is removed.  The tenaculum is removed from the cervix.  Silver nitrate is used to complete hemostasis at that level.  The speculum is removed.  The patient is brought to recovery room in good and stable status.  ESTIMATED BLOOD LOSS: 30 mL   Intake/Output Summary (Last 24 hours) at 10/20/2019 2009 Last data filed at 10/20/2019 1954 Gross per 24 hour  Intake -  Output 30 ml  Net -30 ml     BLOOD  ADMINISTERED:none   LOCAL MEDICATIONS USED:  LIDOCAINE 1% 20 cc  SPECIMEN:  Source of Specimen:  Products of Conception  DISPOSITION OF SPECIMEN:  PATHOLOGY  COUNTS:  YES  PLAN OF CARE: Transfer to PACU  Marie-Lyne LavoieMD8:09 PM

## 2019-10-20 NOTE — H&P (Addendum)
Erica Estes is an 38 y.o. female G70P2A1L2 Married.  Per LMP 09/03/2019 at 6 wks 5/7.  O Negative.  RP: Dizziness/Nausea/Pelvic Pain with 1st trimester pregnancy  HPI:  Had spotting on 09/23/2019.  No vaginal bleeding since then.  Had an Ob US at [facility] yesterday showing debris in the IU cavity, but no viable pregnancy, no GS, no YS.  A CL cyst was present on the Rt Ovary.  No adnexal mass, no FF.   Quant BHCG result from yesterday came back today at 80,169.  Probable h/o Lt ectopic with spontaneous resolution, 2 normal term pregnancies after that.  Rhogam received yesterday.  O neg, Ab neg.  Pertinent Gynecological History: Blood transfusions: none Sexually transmitted diseases: no past history Last pap: normal 10/2018 OB History: G4P2A1   Menstrual History:  LMP 09/03/2019    Past Medical History:  Diagnosis Date  . Abnormal Pap smear   . Compression fracture of T12 vertebra ([place]) 2000  . Depression    Wellbutrin 150 mg daily  . Ectopic pregnancy 2011   no tx needed, "It absorbed itself"  . GERD (gastroesophageal reflux disease)   . IBS (irritable bowel syndrome)   . Postpartum care following vaginal delivery (4/4) 04/04/2015  . Postpartum care following vaginal delivery (4/5) 04/04/2015  . Vaginal Pap smear, abnormal     Past Surgical History:  Procedure Laterality Date  . COLPOSCOPY  2005  . LAPAROSCOPY  2000  . TONSILLECTOMY  12/98    Family History  Problem Relation Age of Onset  . Hyperlipidemia Mother   . Hypertension Mother        controlled by diet  . Cancer Father 81       malignant  . Hyperlipidemia Maternal Aunt   . Depression Maternal Aunt   . Bipolar disorder Maternal Aunt   . Hypertension Maternal Aunt   . Hypertension Maternal Grandfather   . Heart disease Paternal Grandfather     Social History:  reports that she has never smoked. She has never used smokeless tobacco. She reports current alcohol use. She reports that she does not use  drugs.  Allergies: No Known Allergies  (Not in a hospital admission)   REVIEW OF SYSTEMS: A ROS was performed and pertinent positives and negatives are included in the history.  GENERAL: No fevers or chills. HEENT: No change in vision, no earache, sore throat or sinus congestion. NECK: No pain or stiffness. CARDIOVASCULAR: No chest pain or pressure. No palpitations. PULMONARY: No shortness of breath, cough or wheeze. GASTROINTESTINAL: No abdominal pain, nausea, vomiting or diarrhea, melena or bright red blood per rectum. GENITOURINARY: No urinary frequency, urgency, hesitancy or dysuria. MUSCULOSKELETAL: No joint or muscle pain, no back pain, no recent trauma. DERMATOLOGIC: No rash, no itching, no lesions. ENDOCRINE: No polyuria, polydipsia, no heat or cold intolerance. No recent change in weight. HEMATOLOGICAL: No anemia or easy bruising or bleeding. NEUROLOGIC: No headache, seizures, numbness, tingling or weakness. PSYCHIATRIC: No depression, no loss of interest in normal activity or change in sleep pattern.     Blood pressure (!) 156/93, pulse (!) 110, temperature 98.4 F (36.9 C), temperature source Oral, resp. rate 16, height 5\' 7"  (1.702 m), weight 65.8 kg, SpO2 100 %, unknown if currently breastfeeding.  Physical Exam:  See office notes   Results for orders placed or performed during the hospital encounter of 10/20/19 (from the past 24 hour(s))  Basic metabolic panel     Status: Abnormal   Collection Time: 10/20/19  5:35 PM  Result Value Ref Range   Sodium 136 135 - 145 mmol/L   Potassium 3.2 (L) 3.5 - 5.1 mmol/L   Chloride 103 98 - 111 mmol/L   CO2 21 (L) 22 - 32 mmol/L   Glucose, Bld 86 70 - 99 mg/dL   BUN 13 6 - 20 mg/dL   Creatinine, Ser 1.610.70 0.44 - 1.00 mg/dL   Calcium 09.610.3 8.9 - 04.510.3 mg/dL   GFR calc non Af Amer >60 >60 mL/min   GFR calc Af Amer >60 >60 mL/min   Anion gap 12 5 - 15  CBC with Differential     Status: Abnormal   Collection Time: 10/20/19  5:35 PM   Result Value Ref Range   WBC 10.6 (H) 4.0 - 10.5 K/uL   RBC 4.63 3.87 - 5.11 MIL/uL   Hemoglobin 14.4 12.0 - 15.0 g/dL   HCT 40.942.5 81.136.0 - 91.446.0 %   MCV 91.8 80.0 - 100.0 fL   MCH 31.1 26.0 - 34.0 pg   MCHC 33.9 30.0 - 36.0 g/dL   RDW 78.211.8 95.611.5 - 21.315.5 %   Platelets 289 150 - 400 K/uL   nRBC 0.0 0.0 - 0.2 %   Neutrophils Relative % 66 %   Neutro Abs 7.1 1.7 - 7.7 K/uL   Lymphocytes Relative 24 %   Lymphs Abs 2.6 0.7 - 4.0 K/uL   Monocytes Relative 7 %   Monocytes Absolute 0.8 0.1 - 1.0 K/uL   Eosinophils Relative 1 %   Eosinophils Absolute 0.1 0.0 - 0.5 K/uL   Basophils Relative 1 %   Basophils Absolute 0.1 0.0 - 0.1 K/uL   Immature Granulocytes 1 %   Abs Immature Granulocytes 0.05 0.00 - 0.07 K/uL    [place] Ob Transvaginal  Result Date: 10/20/2019 T/V images.  Retroverted uterus with no myometrial mass.  The endometrial cavity is dilated to 1.7 cm with fluid and debris's.  No gestational sac is visible.  Ovaries normal with a 2.4 cm corpus luteum on the right.  No adnexal mass.  No free fluid in the posterior cul-de-sac.  Nontender vaginal exam.    Ob US today: FINDINGS: Intrauterine gestational sac: None  Maternal uterus/adnexae: The endometrial cavity is distended by heterogeneous material which shows internal cystic foci as well as internal blood flow on color Doppler ultrasound. This is suspicious for gestational trophoblastic neoplasia. No fibroids identified.  The right ovary contains a small corpus luteum cyst. The left ovary is normal appearance. No adnexal mass or abnormal free fluid identified.  IMPRESSION: No intrauterine gestational sac or adnexal mass identified.  Large amount of heterogeneous vascular soft tissue density within the endometrial cavity, highly suspicious for molar pregnancy.  Assessment/Plan: 6 weeks and 5 days gestation by last menstrual period, but no viable intrauterine pregnancy per OB ultrasound yesterday.  Per Ob US today:  Highly  suspicious for Molar Pregnancy.  No evidence of ectopic pregnancy by ultrasound with a probable right ovarian corpus luteum cyst.  No free fluid in the pelvis.  Dizziness/nausea/mild pelvic pain.  No vaginal bleeding.  Quantitative hCG high at 80,169 on October 19, 2019.  Today increased to 109, 594.   Highly suspicious for Molar pregnancy.  Blood group O negative with antibody negative.  RhoGam received yesterday.  Hemoglobin 14.4 today.  Quant hCG today at 109, 594.   SARS Coronavirus negative  Informed consent signed for Dilation and evacuation.  Patient was counseled as to the risk of surgery to include the following:  1. Infection (prohylactic antibiotics will be administered)  2. DVT/Pulmonary Embolism (prophylactic pneumo compression stockings will be used)  3.Trauma to internal organs requiring additional surgical procedure to repair any injury to internal organs requiring perhaps additional hospitalization days.  4.Hemmorhage requiring transfusion and blood products which carry risks such as    anaphylactic reaction, hepatitis and AIDS  Patient had received literature information on the procedure scheduled and all her questions were answered and fully accepts all risk.   Genia Del 10/20/2019, 6:35 PM

## 2019-10-20 NOTE — Telephone Encounter (Signed)
Was in yesterday with "probable early complete sp ab". Today dizzy and extremely nauseated and has vomited.   Has not had any nausea previous to this with being pregnant this time. From sitting to standing feels like she sees stars and might pass out.  Still no bleeding.  Having some more cramping going on now.  No fever.

## 2019-10-21 ENCOUNTER — Telehealth: Payer: Self-pay | Admitting: *Deleted

## 2019-10-21 ENCOUNTER — Other Ambulatory Visit: Payer: Managed Care, Other (non HMO)

## 2019-10-21 ENCOUNTER — Encounter (HOSPITAL_COMMUNITY): Payer: Self-pay | Admitting: Obstetrics & Gynecology

## 2019-10-21 LAB — TYPE AND SCREEN
ABO/RH(D): O NEG
Antibody Screen: POSITIVE

## 2019-10-21 LAB — ABO/RH: ABO/RH(D): O NEG

## 2019-10-21 NOTE — Anesthesia Postprocedure Evaluation (Signed)
Anesthesia Post Note  Patient: IYESHA SUCH  Procedure(s) Performed: SUCTION DILATION AND EVACUATION WITH CERVICAL BLOCK (N/A Uterus)     Patient location during evaluation: PACU Anesthesia Type: General Level of consciousness: awake and alert Pain management: pain level controlled Vital Signs Assessment: post-procedure vital signs reviewed and stable Respiratory status: spontaneous breathing, nonlabored ventilation and respiratory function stable Cardiovascular status: blood pressure returned to baseline and stable Postop Assessment: no apparent nausea or vomiting Anesthetic complications: no    Last Vitals:  Vitals:   10/20/19 2030 10/20/19 2045  BP: 125/75 116/70  Pulse: (!) 102 98  Resp: 14 17  Temp:  36.6 C  SpO2: 100% 100%    Last Pain:  Vitals:   10/21/19 1622  TempSrc:   PainSc: 2                  Catalina Gravel

## 2019-10-21 NOTE — Telephone Encounter (Signed)
Pt called requesting  HCG  Quantitative test done every week at [place]  Will route to  MD for orders/approval

## 2019-10-22 LAB — SURGICAL PATHOLOGY

## 2019-10-22 NOTE — Telephone Encounter (Signed)
Yes, just sent you a message about that.  Please inform patient of the pathology report.

## 2019-10-25 ENCOUNTER — Other Ambulatory Visit: Payer: Self-pay | Admitting: Obstetrics & Gynecology

## 2019-10-25 NOTE — Telephone Encounter (Signed)
Notes recorded by Thamas Jaegers, RMA on 10/22/2019 at 11:54 AM EDT  Patient called back with fax # to [facility] [phone] for BHCG quant x weekly until negative for now. Order faxed

## 2019-10-26 LAB — BETA HCG QUANT (REF LAB): hCG Quant: 2130 m[IU]/mL

## 2019-11-01 ENCOUNTER — Other Ambulatory Visit: Payer: Self-pay | Admitting: Obstetrics & Gynecology

## 2019-11-02 LAB — BETA HCG QUANT (REF LAB): hCG Quant: 256 m[IU]/mL

## 2019-11-08 ENCOUNTER — Other Ambulatory Visit: Payer: Self-pay | Admitting: Obstetrics & Gynecology

## 2019-11-09 LAB — BETA HCG QUANT (REF LAB): hCG Quant: 40 m[IU]/mL

## 2019-11-11 ENCOUNTER — Other Ambulatory Visit: Payer: Self-pay

## 2019-11-12 ENCOUNTER — Encounter: Payer: Self-pay | Admitting: *Deleted

## 2019-11-12 ENCOUNTER — Encounter: Payer: Self-pay | Admitting: Obstetrics & Gynecology

## 2019-11-12 ENCOUNTER — Ambulatory Visit: Payer: Managed Care, Other (non HMO) | Admitting: Obstetrics & Gynecology

## 2019-11-12 VITALS — BP 130/80

## 2019-11-12 DIAGNOSIS — O020 Blighted ovum and nonhydatidiform mole: Secondary | ICD-10-CM

## 2019-11-12 DIAGNOSIS — Z09 Encounter for follow-up examination after completed treatment for conditions other than malignant neoplasm: Secondary | ICD-10-CM

## 2019-11-12 NOTE — Patient Instructions (Signed)
1. Molar pregnancy Early molar pregnancy clinically, including the high increasing quant beta hCG in spite of no embryo and the ultrasound finding of a very thick placenta, but no evidence of molar tissue on pathology report of products of conception.  Very good postop evolution post D&E which was on October 20, 2019.  Very good decrease in quant beta hCG postop.  Last quant beta hCG was at 40 on November 08, 2019.  We will continue to follow quant beta hCG weekly until negative.  Will then repeat quant beta hCG monthly for 3 months.  Given the early treatment of her probable molar pregnancy with no pathology confirmation and patient's age of 41+ years with desire to conceive, decision to attempt conception after 3 months of negative quant beta hCG.  2. Status post gynecological surgery, follow-up exam Good postop evolution.  Normal gynecologic exam.  Eliot, it was a pleasure seeing you today as always!

## 2019-11-12 NOTE — Progress Notes (Signed)
    Erica Estes [birth year] [id number]        38 y.o.  [id number] Married  RP: Post D+E probable early molar pregnancy  HPI: High and rapidly increasing Quant BHCG with no Embryo and large placenta suspicious of molar pregnancy per [place].  D+E done on  .  Patho POC, no confirmation of molar pregnancy.  Very good POp course with nicely decreasing Quant BHCG. Quant BHCG at 40 this week.  Had a normal menstrual period last week.  No pelvic pain.  No vaginal discharge.  No fever.   OB History  Gravida Para Term Preterm AB Living  3 2 2  0 1 2  SAB TAB Ectopic Multiple Live Births  0 0 1 0 2    # Outcome Date GA Lbr Len/2nd Weight Sex Delivery Anes PTL Lv  3 Term 04/04/15 [redacted]w[redacted]d 15:05 / 00:25 7 lb 15.7 oz (3.62 kg) M Vag-Spont EPI  LIV  2 Term 12/29/12 [redacted]w[redacted]d 23:16 / 00:43 6 lb 15 oz (3.147 kg) M Vag-Spont EPI  LIV  1 Ectopic 2011            Past medical history,surgical history, problem list, medications, allergies, family history and social history were all reviewed and documented in the EPIC chart.   Directed ROS with pertinent positives and negatives documented in the history of present illness/assessment and plan.  Exam:  Vitals:   11/12/19 1234  BP: 130/80   General appearance:  Normal  Abdomen: Normal  Gynecologic exam: Vulva normal.  Bimanual exam: Uterus anteverted, normal volume, mobile, nontender.  No adnexal mass, nontender bilaterally.  No bleeding and normal vaginal secretions.  Quant BHCG decreasing very well.  Last quant beta hCG at 40 on November 08, 2019.   Assessment/Plan:  38 y.o. [id number]   1. Molar pregnancy Early molar pregnancy clinically, including the high increasing quant beta hCG in spite of no embryo and the ultrasound finding of a very thick placenta, but no evidence of molar tissue on pathology report of products of conception.  Very good postop evolution post D&E which was on October 20, 2019.  Very good decrease in quant beta hCG postop.  Last quant beta  hCG was at 40 on November 08, 2019.  We will continue to follow quant beta hCG weekly until negative.  Will then repeat quant beta hCG monthly for 3 months.  Given the early treatment of her probable molar pregnancy with no pathology confirmation and patient's age of 38+ years with desire to conceive, decision to attempt conception after 3 months of negative quant beta hCG.  2. Status post gynecological surgery, follow-up exam Good postop evolution.  Normal gynecologic exam.  Counseling on above issues and coordination of care more than 50% for 15 minutes.  Princess Bruins MD, 1:00 PM 11/12/2019

## 2019-11-15 ENCOUNTER — Encounter: Payer: Managed Care, Other (non HMO) | Admitting: Obstetrics & Gynecology

## 2019-11-15 ENCOUNTER — Other Ambulatory Visit: Payer: Self-pay | Admitting: Obstetrics & Gynecology

## 2019-11-16 LAB — BETA HCG QUANT (REF LAB): hCG Quant: 8 m[IU]/mL

## 2019-11-22 ENCOUNTER — Other Ambulatory Visit: Payer: Self-pay | Admitting: Obstetrics & Gynecology

## 2019-11-23 LAB — BETA HCG QUANT (REF LAB): hCG Quant: 3 m[IU]/mL

## 2019-12-31 DIAGNOSIS — N978 Female infertility of other origin: Secondary | ICD-10-CM

## 2019-12-31 HISTORY — DX: Female infertility of other origin: N97.8

## 2020-01-03 ENCOUNTER — Other Ambulatory Visit: Payer: Self-pay | Admitting: Obstetrics & Gynecology

## 2020-01-04 LAB — BETA HCG QUANT (REF LAB): hCG Quant: <1 m[IU]/mL

## 2020-02-01 NOTE — Telephone Encounter (Signed)
Ok to fax order for "request tests for [facility] an [facility]"?  If ok, what to use for diagnosis?

## 2020-02-01 NOTE — Telephone Encounter (Signed)
You do not want to order the [facility]? Just double checking.

## 2020-02-01 NOTE — Telephone Encounter (Signed)
Lab orders faxed.  fax # to [facility] [phone] for BHCG

## 2020-02-04 ENCOUNTER — Other Ambulatory Visit: Payer: Self-pay | Admitting: Obstetrics & Gynecology

## 2020-02-05 LAB — BETA HCG QUANT (REF LAB): hCG Quant: <1 m[IU]/mL

## 2020-02-14 ENCOUNTER — Encounter: Payer: Self-pay | Admitting: Obstetrics & Gynecology

## 2020-02-14 ENCOUNTER — Other Ambulatory Visit: Payer: Self-pay | Admitting: Obstetrics & Gynecology

## 2020-02-19 LAB — ANTI MULLERIAN HORMONE: ANTI-MULLERIAN HORMONE (AMH): 3.79 ng/mL

## 2020-03-01 LAB — FOLLICLE STIMULATING HORMONE: FSH: 7.7 m[IU]/mL

## 2020-03-01 LAB — SPECIMEN STATUS REPORT

## 2020-04-19 ENCOUNTER — Other Ambulatory Visit: Payer: Self-pay

## 2020-04-20 ENCOUNTER — Ambulatory Visit (INDEPENDENT_AMBULATORY_CARE_PROVIDER_SITE_OTHER): Payer: Managed Care, Other (non HMO) | Admitting: Obstetrics & Gynecology

## 2020-04-20 ENCOUNTER — Encounter: Payer: Self-pay | Admitting: Obstetrics & Gynecology

## 2020-04-20 VITALS — BP 120/70 | Ht 67.0 in | Wt 150.0 lb

## 2020-04-20 DIAGNOSIS — N921 Excessive and frequent menstruation with irregular cycle: Secondary | ICD-10-CM | POA: Diagnosis not present

## 2020-04-20 DIAGNOSIS — N978 Female infertility of other origin: Secondary | ICD-10-CM

## 2020-04-20 DIAGNOSIS — Z01419 Encounter for gynecological examination (general) (routine) without abnormal findings: Secondary | ICD-10-CM

## 2020-04-20 DIAGNOSIS — N979 Female infertility, unspecified: Secondary | ICD-10-CM

## 2020-04-20 NOTE — Progress Notes (Addendum)
Erica Estes [birth year] [id number]   History:    39 y.o. G35P2A2L2 Married.  Boys 5 and 37 yo.  RP:  Established patient presenting for annual gyn exam   HPI:  Secondary infertility with a Molar pregnancy in 10/2019.  Menses regular q26-27 days with ovulation documented on day 13-14 of the cycle.  On cycles when attempting conception, has spotting at mid-luteal phase.  FSH 7.7 and AMH 3.79 in 01/2020.  No pain with IC.  Breasts wnl.  Urine/BMs wnl.  BMI 23.49.  Walking.   Past medical history,surgical history, family history and social history were all reviewed and documented in the EPIC chart.  Gynecologic History Patient's last menstrual period was 04/20/2020.   ROS: A ROS was performed and pertinent positives and negatives are included in the history.  GENERAL: No fevers or chills. HEENT: No change in vision, no earache, sore throat or sinus congestion. NECK: No pain or stiffness. CARDIOVASCULAR: No chest pain or pressure. No palpitations. PULMONARY: No shortness of breath, cough or wheeze. GASTROINTESTINAL: No abdominal pain, nausea, vomiting or diarrhea, melena or bright red blood per rectum. GENITOURINARY: No urinary frequency, urgency, hesitancy or dysuria. MUSCULOSKELETAL: No joint or muscle pain, no back pain, no recent trauma. DERMATOLOGIC: No rash, no itching, no lesions. ENDOCRINE: No polyuria, polydipsia, no heat or cold intolerance. No recent change in weight. HEMATOLOGICAL: No anemia or easy bruising or bleeding. NEUROLOGIC: No headache, seizures, numbness, tingling or weakness. PSYCHIATRIC: No depression, no loss of interest in normal activity or change in sleep pattern.     Exam:   BP 120/70   Ht 5\' 7"  (1.702 m)   Wt 150 lb (68 kg)   LMP 04/20/2020 Comment: no birth control   BMI 23.49 kg/m   Body mass index is 23.49 kg/m.  General appearance : Well developed well nourished female. No acute distress HEENT: Eyes: no retinal hemorrhage or exudates,  Neck supple,  trachea midline, no carotid bruits, no thyroidmegaly Lungs: Clear to auscultation, no rhonchi or wheezes, or rib retractions  Heart: Regular rate and rhythm, no murmurs or gallops Breast:Examined in sitting and supine position were symmetrical in appearance, no palpable masses or tenderness,  no skin retraction, no nipple inversion, no nipple discharge, no skin discoloration, no axillary or supraclavicular lymphadenopathy Abdomen: no palpable masses or tenderness, no rebound or guarding Extremities: no edema or skin discoloration or tenderness  Pelvic: Vulva: Normal             Vagina: No gross lesions or discharge  Cervix: No gross lesions or discharge.  Pap reflex done.  Uterus  AV, normal size, shape and consistency, non-tender and mobile  Adnexa  Without masses or tenderness  Anus: Normal   Assessment/Plan:  39 y.o. female for annual exam   1. Encounter for routine gynecological examination with Papanicolaou smear of cervix Normal gynecologic exam.  Pap reflex done.  Breast exam normal.  Body mass index good at 23.49.  Continue with walking and healthy nutrition.    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  Menstrual cycles are ovulatory with a 13 day luteal phase, but with bleeding at mid-luteal phase.  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  Will supplement with progesterone as needed.  Will also complete the investigation with a pelvic ultrasound to assure no endometrial pathology. - US Transvaginal Non-OB; Future - Progesterone; Future  4. Breakthrough bleeding Breakthrough bleeding with mid luteal phase spotting.  Follow-up pelvic ultrasound. - US Transvaginal Non-OB; Future - Progesterone; Future  Other orders - aspirin EC 81 MG tablet; Take 81 mg by mouth daily. - Ascorbic Acid (VITAMIN C) 1000 MG tablet; Take 1,000 mg by mouth daily. - cholecalciferol (VITAMIN D3) 25 MCG (1000 UNIT) tablet; Take  1,000 Units by mouth daily. - Coenzyme Q10 100 MG TABS; Take by mouth.  Genia Del MD, 8:17 AM 04/20/2020

## 2020-04-21 ENCOUNTER — Encounter: Payer: Self-pay | Admitting: Obstetrics & Gynecology

## 2020-04-21 LAB — PAP IG W/ RFLX HPV ASCU

## 2020-04-21 NOTE — Patient Instructions (Signed)
1. Encounter for routine gynecological examination with Papanicolaou smear of cervix Normal gynecologic exam.  Pap reflex done.  Breast exam normal.  Body mass index good at 23.49.  Continue with walking and healthy nutrition.    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  Menstrual cycles are ovulatory but luteal phase is longer at about 18 days.  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  Will supplement with progesterone as needed.  Will also complete the investigation with a pelvic ultrasound to assure no endometrial pathology. - US Transvaginal Non-OB; Future - Progesterone; Future  4. Breakthrough bleeding Breakthrough bleeding with mid luteal phase spotting.  Follow-up pelvic ultrasound. - US Transvaginal Non-OB; Future - Progesterone; Future  Other orders - aspirin EC 81 MG tablet; Take 81 mg by mouth daily. - Ascorbic Acid (VITAMIN C) 1000 MG tablet; Take 1,000 mg by mouth daily. - cholecalciferol (VITAMIN D3) 25 MCG (1000 UNIT) tablet; Take 1,000 Units by mouth daily. - Coenzyme Q10 100 MG TABS; Take by mouth.  Erica Estes, it was a pleasure seeing you today!  I will inform you of your results as soon as they are available.

## 2020-05-01 ENCOUNTER — Encounter: Payer: Self-pay | Admitting: *Deleted

## 2020-05-09 ENCOUNTER — Other Ambulatory Visit: Payer: Self-pay

## 2020-05-09 ENCOUNTER — Ambulatory Visit: Payer: Managed Care, Other (non HMO) | Admitting: Obstetrics & Gynecology

## 2020-05-09 ENCOUNTER — Ambulatory Visit (INDEPENDENT_AMBULATORY_CARE_PROVIDER_SITE_OTHER): Payer: Managed Care, Other (non HMO)

## 2020-05-09 ENCOUNTER — Encounter: Payer: Self-pay | Admitting: Obstetrics & Gynecology

## 2020-05-09 VITALS — BP 128/82

## 2020-05-09 DIAGNOSIS — N978 Female infertility of other origin: Secondary | ICD-10-CM

## 2020-05-09 DIAGNOSIS — N83202 Unspecified ovarian cyst, left side: Secondary | ICD-10-CM

## 2020-05-09 DIAGNOSIS — N921 Excessive and frequent menstruation with irregular cycle: Secondary | ICD-10-CM

## 2020-05-09 DIAGNOSIS — N979 Female infertility, unspecified: Secondary | ICD-10-CM | POA: Diagnosis not present

## 2020-05-09 DIAGNOSIS — N854 Malposition of uterus: Secondary | ICD-10-CM

## 2020-05-09 MED ORDER — PROGESTERONE 200 MG PO CAPS: 200.0000 mg | ORAL_CAPSULE | Freq: Every day | ORAL | 4 refills | Status: DC

## 2020-05-09 NOTE — Progress Notes (Signed)
    Erica Estes Feb 11, 1981 [id number]        39 y.o.  [id number] Married    RP: Secondary infertility/BTB in luteal phase for Pelvic US  HPI: On day #20 of cycle today.  Last visit on 04/20/20 we noted: Secondary infertility with a Molar pregnancy in 10/2019.  Menses regular q26-27 days with ovulation documented on day 13-14 of the cycle.  On cycles when attempting conception, has spotting at mid-luteal phase. FSH 7.7 and AMH 3.79 in 01/2020. No pain with IC.    OB History  Gravida Para Term Preterm AB Living  4 2 2  0 2 2  SAB TAB Ectopic Multiple Live Births  0 0 1 0 2    # Outcome Date GA Lbr Len/2nd Weight Sex Delivery Anes PTL Lv  4 Molar 10/20/19          3 Term 04/04/15 [redacted]w[redacted]d 15:05 / 00:25 7 lb 15.7 oz (3.62 kg) M Vag-Spont EPI  LIV  2 Term 12/29/12 [redacted]w[redacted]d 23:16 / 00:43 6 lb 15 oz (3.147 kg) M Vag-Spont EPI  LIV  1 Ectopic 2011            Past medical history,surgical history, problem list, medications, allergies, family history and social history were all reviewed and documented in the EPIC chart.   Directed ROS with pertinent positives and negatives documented in the history of present illness/assessment and plan.  Exam:  Vitals:   05/09/20 0959  BP: 128/82   General appearance:  Normal  Pelvic 07/09/20 today: T/V images.  Anteverted uterus normal in size and shape with no myometrial mass.  The uterus is measured at 7.29 x 4.6 x 3.35 cm.  Symmetrical Secretary endometrial lining measured at 5.19 mm with no mass or thickening seen. 3D reveals normally shaped cavity.  Both ovaries are normal in size with normal follicular pattern.  A resolving corpus luteum cyst is present on the left side measured at 1.6 cm.  Cycle day #20.  No adnexal mass seen.  No free fluid in the posterior cul-de-sac.   Assessment/Plan:  39 y.o. 30   1. 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  Menses regular q26-27 days with ovulation documented on day  13-14 of the cycle.  On cycles when attempting conception, has spotting at mid-luteal phase.  FSH 7.7 and AMH 3.79 in 01/2020. No pain with IC.  Pelvic 02/2020 findings reviewed with patient.  Uterus/Endometrial line normal.  Left ovary with a resolving CL cyst on day #20.  Very reassuring findings.    2. 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  Usage reviewed.  Prescription of Micronized Progesterone 200 mg HS sent to pharmacy.  Patient will start each cycle at mid-luteal phase when attempting conception.  Will stop if HPT is negative and continue until 12 weeks if pregnant.  Other orders - progesterone (PROMETRIUM) 200 MG capsule; Take 1 capsule (200 mg total) by mouth at bedtime.  [place] MD, 10:27 AM 05/09/2020

## 2020-05-09 NOTE — Patient Instructions (Signed)
1. 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  Menses regular q26-27 days with ovulation documented on day 13-14 of the cycle.  On cycles when attempting conception, has spotting at mid-luteal phase.  FSH 7.7 and AMH 3.79 in 01/2020. No pain with IC.  Pelvic US findings reviewed with patient.  Uterus/Endometrial line normal.  Left ovary with a resolving CL cyst on day #20.  Very reassuring findings.    2. 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  Usage reviewed.  Prescription of Micronized Progesterone 200 mg HS sent to pharmacy.  Patient will start each cycle at mid-luteal phase when attempting conception.  Will stop if HPT is negative and continue until 12 weeks if pregnant.  Other orders - progesterone (PROMETRIUM) 200 MG capsule; Take 1 capsule (200 mg total) by mouth at bedtime.  [name], it was a pleasure seeing you today!

## 2020-05-23 ENCOUNTER — Ambulatory Visit: Payer: Managed Care, Other (non HMO) | Admitting: Obstetrics & Gynecology

## 2020-05-24 ENCOUNTER — Other Ambulatory Visit: Payer: Self-pay

## 2020-05-25 ENCOUNTER — Ambulatory Visit: Payer: Managed Care, Other (non HMO) | Admitting: Obstetrics & Gynecology

## 2020-05-25 ENCOUNTER — Encounter: Payer: Self-pay | Admitting: Obstetrics & Gynecology

## 2020-05-25 VITALS — BP 140/90

## 2020-05-25 DIAGNOSIS — N978 Female infertility of other origin: Secondary | ICD-10-CM

## 2020-05-25 DIAGNOSIS — Z3201 Encounter for pregnancy test, result positive: Secondary | ICD-10-CM

## 2020-05-25 DIAGNOSIS — Z8759 Personal history of other complications of pregnancy, childbirth and the puerperium: Secondary | ICD-10-CM

## 2020-05-25 DIAGNOSIS — O020 Blighted ovum and nonhydatidiform mole: Secondary | ICD-10-CM

## 2020-05-25 DIAGNOSIS — Z3200 Encounter for pregnancy test, result unknown: Secondary | ICD-10-CM

## 2020-05-25 LAB — PREGNANCY, URINE: Preg Test, Ur: POSITIVE — AB

## 2020-05-25 NOTE — Patient Instructions (Addendum)
1. Encounter for confirmation of pregnancy test result with physical examination First trimester pregnancy at 5 wks per LMP.  EDD 01/25/2021.  No vaginal bleeding or pelvic pain and normal gynecologic exam.  Urine pregnancy test positive.  Continue prenatal vitamins and progesterone.  Follow-up first trimester OB ultrasound at 6+ weeks. - Pregnancy, urine  2. 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  3.  History of molar pregnancy Given the history of a molar pregnancy clinically October 2020, will proceed with a first trimester transvaginal OB ultrasound at 6+ weeks. - US OB Transvaginal; Future  Other orders - Prenatal Vit-Fe Fumarate-FA (PRENATAL MULTIVITAMIN) TABS tablet; Take 1 tablet by mouth daily at 12 noon.  Erica Estes, it was a pleasure seeing you today!  Congrats!

## 2020-05-25 NOTE — Progress Notes (Signed)
    Erica Estes 07/09/1981 [id number]        39 y.o.  [id number] Married.  LMP 04/20/2020.    RP: Confirmation of pregnancy  HPI: Last menstrual period April 20, 2020.  Started on Prometrium 200 mg per mouth at bedtime for luteal phase insufficiency.  Minimal spotting after intercourse.  No pelvic pain.  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   OB History  Gravida Para Term Preterm AB Living  4 2 2  0 2 2  SAB TAB Ectopic Multiple Live Births  0 0 1 0 2    # Outcome Date GA Lbr Len/2nd Weight Sex Delivery Anes PTL Lv  4 Molar 10/20/19          3 Term 04/04/15 [redacted]w[redacted]d 15:05 / 00:25 7 lb 15.7 oz (3.62 kg) M Vag-Spont EPI  LIV  2 Term 12/29/12 [redacted]w[redacted]d 23:16 / 00:43 6 lb 15 oz (3.147 kg) M Vag-Spont EPI  LIV  1 Ectopic 2011            Past medical history,surgical history, problem list, medications, allergies, family history and social history were all reviewed and documented in the EPIC chart.   Directed ROS with pertinent positives and negatives documented in the history of present illness/assessment and plan.  Exam:  Vitals:   05/25/20 1444  BP: 140/90   General appearance:  Normal  Abdomen: Normal  Gynecologic exam: Vulva normal.  Bimanual exam:  AV uterus normal volume, mobile, NT.  Cervix long/closed. No adnexal mass, NT.  No blood, normal secretions.  UPT positive   Assessment/Plan:  39 y.o. 30   1. Encounter for confirmation of pregnancy test result with physical examination First trimester pregnancy at 5 wks per LMP.  EDD 01/25/2021.  No vaginal bleeding or pelvic pain and normal gynecologic exam.  Urine pregnancy test positive.  Continue prenatal vitamins and progesterone.  Follow-up first trimester OB ultrasound at 6+ weeks. - Pregnancy, urine  2. 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  3.  History of molar pregnancy Given the history of a molar pregnancy clinically  October 2020, will proceed with a first trimester transvaginal OB ultrasound at 6+ weeks. - 02[birth year]26[birth year]1974 OB Transvaginal; Future  Other orders - Prenatal Vit-Fe Fumarate-FA (PRENATAL MULTIVITAMIN) TABS tablet; Take 1 tablet by mouth daily at 12 noon.  [place] MD, 2:57 PM 05/25/2020

## 2020-06-02 ENCOUNTER — Ambulatory Visit: Payer: Managed Care, Other (non HMO) | Admitting: Obstetrics & Gynecology

## 2020-06-05 ENCOUNTER — Other Ambulatory Visit: Payer: Self-pay

## 2020-06-06 ENCOUNTER — Encounter: Payer: Self-pay | Admitting: Obstetrics & Gynecology

## 2020-06-06 ENCOUNTER — Ambulatory Visit (INDEPENDENT_AMBULATORY_CARE_PROVIDER_SITE_OTHER): Payer: Managed Care, Other (non HMO) | Admitting: Obstetrics & Gynecology

## 2020-06-06 ENCOUNTER — Ambulatory Visit (INDEPENDENT_AMBULATORY_CARE_PROVIDER_SITE_OTHER): Payer: Managed Care, Other (non HMO)

## 2020-06-06 DIAGNOSIS — O3680X1 Pregnancy with inconclusive fetal viability, fetus 1: Secondary | ICD-10-CM | POA: Diagnosis not present

## 2020-06-06 DIAGNOSIS — Z3491 Encounter for supervision of normal pregnancy, unspecified, first trimester: Secondary | ICD-10-CM

## 2020-06-06 DIAGNOSIS — O020 Blighted ovum and nonhydatidiform mole: Secondary | ICD-10-CM

## 2020-06-06 DIAGNOSIS — N978 Female infertility of other origin: Secondary | ICD-10-CM

## 2020-06-06 DIAGNOSIS — Z3A01 Less than 8 weeks gestation of pregnancy: Secondary | ICD-10-CM

## 2020-06-06 DIAGNOSIS — Z8759 Personal history of other complications of pregnancy, childbirth and the puerperium: Secondary | ICD-10-CM | POA: Diagnosis not present

## 2020-06-06 NOTE — Progress Notes (Signed)
    Erica Estes December 22, 1981 [id number]        39 y.o.  [id number] Married.  LMP 04/20/2020  RP: FIrst trimester pregnancy with risk factors for Ob US  HPI: No pelvic pain.  No vaginal bleeding.  No vomiting.  Urine/BMs normal.   OB History  Gravida Para Term Preterm AB Living  4 2 2  0 2 2  SAB TAB Ectopic Multiple Live Births  0 0 1 0 2    # Outcome Date GA Lbr Len/2nd Weight Sex Delivery Anes PTL Lv  4 Molar 10/20/19          3 Term 04/04/15 [redacted]w[redacted]d 15:05 / 00:25 7 lb 15.7 oz (3.62 kg) M Vag-Spont EPI  LIV  2 Term 12/29/12 [redacted]w[redacted]d 23:16 / 00:43 6 lb 15 oz (3.147 kg) M Vag-Spont EPI  LIV  1 Ectopic 2011            Past medical history,surgical history, problem list, medications, allergies, family history and social history were all reviewed and documented in the EPIC chart.   Directed ROS with pertinent positives and negatives documented in the history of present illness/assessment and plan.  Exam:  There were no vitals filed for this visit. General appearance:  Normal  Ob 2012 today: Vaginal scan.  Retroverted uterus.  Single viable intrauterine pregnancy with a crown-rump length compatible with last menstrual period dates on April 20, 2020 at 6 weeks and 5 days gestation for an expected date of delivery January 26, 2021.  Crown-rump length was measured at 0.83 cm which gives 6 weeks and 4 days.  Fetal heart rate 98 bpm which is within normal for gestational age.  Normal yolk sac.  Cervix is long and closed.  Adnexa within normal with a 1.8 cm resolving corpus luteum cyst on the left ovary.  No free fluid in the posterior cul-de-sac.   Assessment/Plan:  39 y.o. 30   1. First trimester pregnancy Normal first trimester pregnancy.  Ob [id number] reviewed with patient.  sIUP 6 5/7 wks with FHR 98/min.  Taking PNVs.  Will establish care with Ob practice close to where she lives.  2. 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  3. Molar  pregnancy  4. Hx of ectopic pregnancy  7/7 MD, 9:17 AM 06/06/2020

## 2020-06-12 ENCOUNTER — Encounter: Payer: Self-pay | Admitting: Obstetrics & Gynecology

## 2020-06-12 NOTE — Patient Instructions (Signed)
1. First trimester pregnancy Normal first trimester pregnancy.  Ob US reviewed with patient.  sIUP 6 5/7 wks with FHR 98/min.  Taking PNVs.  Will establish care with Ob practice close to where she lives.  2. 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  3. Molar pregnancy  4. Hx of ectopic pregnancy  Erica Estes, it was a pleasure seeing you today!  Congrats!

## 2020-07-04 DIAGNOSIS — F32A Depression, unspecified: Secondary | ICD-10-CM | POA: Insufficient documentation

## 2020-07-04 DIAGNOSIS — Z8759 Personal history of other complications of pregnancy, childbirth and the puerperium: Secondary | ICD-10-CM | POA: Insufficient documentation

## 2020-07-04 DIAGNOSIS — Z8669 Personal history of other diseases of the nervous system and sense organs: Secondary | ICD-10-CM | POA: Insufficient documentation

## 2020-07-04 DIAGNOSIS — F419 Anxiety disorder, unspecified: Secondary | ICD-10-CM | POA: Insufficient documentation

## 2020-07-28 DIAGNOSIS — O09529 Supervision of elderly multigravida, unspecified trimester: Secondary | ICD-10-CM | POA: Insufficient documentation

## 2020-08-04 ENCOUNTER — Inpatient Hospital Stay (HOSPITAL_COMMUNITY)
Admission: AD | Admit: 2020-08-04 | Discharge: 2020-08-04 | Disposition: A | Discharge status: Home / Self Care | Payer: Managed Care, Other (non HMO) | Attending: Obstetrics & Gynecology | Admitting: Obstetrics & Gynecology

## 2020-08-04 ENCOUNTER — Other Ambulatory Visit: Payer: Self-pay

## 2020-08-04 ENCOUNTER — Encounter (HOSPITAL_COMMUNITY): Payer: Self-pay | Admitting: Obstetrics & Gynecology

## 2020-08-04 DIAGNOSIS — O09292 Supervision of pregnancy with other poor reproductive or obstetric history, second trimester: Secondary | ICD-10-CM | POA: Diagnosis not present

## 2020-08-04 DIAGNOSIS — O99612 Diseases of the digestive system complicating pregnancy, second trimester: Secondary | ICD-10-CM | POA: Diagnosis not present

## 2020-08-04 DIAGNOSIS — Z3A15 15 weeks gestation of pregnancy: Secondary | ICD-10-CM

## 2020-08-04 DIAGNOSIS — Z79890 Hormone replacement therapy (postmenopausal): Secondary | ICD-10-CM | POA: Insufficient documentation

## 2020-08-04 DIAGNOSIS — K589 Irritable bowel syndrome without diarrhea: Secondary | ICD-10-CM | POA: Insufficient documentation

## 2020-08-04 DIAGNOSIS — K219 Gastro-esophageal reflux disease without esophagitis: Secondary | ICD-10-CM | POA: Insufficient documentation

## 2020-08-04 DIAGNOSIS — O209 Hemorrhage in early pregnancy, unspecified: Secondary | ICD-10-CM | POA: Diagnosis present

## 2020-08-04 DIAGNOSIS — O360930 Maternal care for other rhesus isoimmunization, third trimester, not applicable or unspecified: Secondary | ICD-10-CM

## 2020-08-04 DIAGNOSIS — Z6741 Type O blood, Rh negative: Secondary | ICD-10-CM | POA: Diagnosis not present

## 2020-08-04 DIAGNOSIS — O09522 Supervision of elderly multigravida, second trimester: Secondary | ICD-10-CM | POA: Insufficient documentation

## 2020-08-04 DIAGNOSIS — Z6791 Unspecified blood type, Rh negative: Secondary | ICD-10-CM | POA: Diagnosis not present

## 2020-08-04 DIAGNOSIS — Z7982 Long term (current) use of aspirin: Secondary | ICD-10-CM | POA: Insufficient documentation

## 2020-08-04 DIAGNOSIS — O26892 Other specified pregnancy related conditions, second trimester: Secondary | ICD-10-CM | POA: Diagnosis not present

## 2020-08-04 DIAGNOSIS — Z79899 Other long term (current) drug therapy: Secondary | ICD-10-CM | POA: Diagnosis not present

## 2020-08-04 DIAGNOSIS — O4692 Antepartum hemorrhage, unspecified, second trimester: Secondary | ICD-10-CM

## 2020-08-04 DIAGNOSIS — Z8759 Personal history of other complications of pregnancy, childbirth and the puerperium: Secondary | ICD-10-CM | POA: Diagnosis not present

## 2020-08-04 HISTORY — DX: Headache, unspecified: R51.9

## 2020-08-04 MED ORDER — RHO D IMMUNE GLOBULIN 1500 UNIT/2ML IJ SOSY
300.0000 ug | PREFILLED_SYRINGE | Freq: Once | INTRAMUSCULAR | Status: AC
  Administered 2020-08-04: 300 ug via INTRAMUSCULAR
  Filled 2020-08-04: qty 2

## 2020-08-04 NOTE — MAU Provider Note (Signed)
Chief Complaint: Vaginal Bleeding   First Provider Initiated Contact with Patient 08/04/20 1848     SUBJECTIVE HPI: Erica Estes is a 39 y.o. [id number] at [redacted]w[redacted]d who presents to Maternity Admissions reporting vaginal bleeding. Reports one episode of dark red blood on toilet paper after straining for a bowel movement. Also noticed one small blood clot. Bleeding has not continued. Denies abdominal pain.  She is rh negative & has not received rhogam yet this pregnancy.  Goes to [facility] for prenatal care & has appointment at the end of the month.   Past Medical History:  Diagnosis Date  . Abnormal Pap smear   . Compression fracture of T12 vertebra (HCC) 2000  . Depression    Wellbutrin 150 mg daily  . Ectopic pregnancy 2011   no tx needed, "It absorbed itself"  . GERD (gastroesophageal reflux disease)   . Headache   . IBS (irritable bowel syndrome)   . 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 COLPOSCOPY  2005  . DIAGNOSTIC LAPAROSCOPY WITH REMOVAL OF ECTOPIC PREGNANCY N/A 10/20/2019   Procedure: SUCTION DILATION AND EVACUATION WITH CERVICAL BLOCK;  Surgeon: 10/22/2019, MD;  Location: [facility];  Service: Gynecology;  Laterality: N/A;  . DILATION AND CURETTAGE OF UTERUS    . LAPAROSCOPY  2000  . TONSILLECTOMY  12/98   Social History   Socioeconomic History  . Marital status: Married    Spouse name: Not on file  . Number of children: Not on file  . Years of education: Not on file  . Highest education level: Not on file  Occupational History  .  Not on file  Tobacco Use  . Smoking status: Never Smoker  . Smokeless tobacco: Never Used  Vaping Use  . Vaping Use: Never used  Substance and Sexual Activity  . Alcohol use: Not Currently  . Drug use: No  . Sexual activity: Yes    Partners: Male    Birth control/protection: None    Comment: intercourse age 89, more than 5 sexual partners  Other Topics Concern  . Not on file  Social History Narrative  . Not on file   Social Determinants of Health   Financial Resource Strain:   . Difficulty of Paying Living Expenses:   Food Insecurity:   . Worried About 28 in the Last Year:   . Programme researcher, broadcasting/film/video in the Last Year:   Transportation Needs:   . Barista (Medical):   Freight forwarder Lack of Transportation (Non-Medical):   Physical Activity:   . Days of Exercise per Week:   . Minutes of Exercise per Session:   Stress:   . Feeling of Stress :   Social Connections:   . Frequency of Communication with Friends and Family:   .  Frequency of Social Gatherings with Friends and Family:   . Attends Religious Services:   . Active Member of Clubs or Organizations:   . Attends Banker Meetings:   Marland Kitchen Marital Status:   Intimate Partner Violence:   . Fear of Current or Ex-Partner:   . Emotionally Abused:   Marland Kitchen Physically Abused:   . Sexually Abused:    Family History  Problem Relation Age of Onset  . Hyperlipidemia Mother   . Hypertension Mother        controlled by diet  . Cancer Father 40       malignant  . Hyperlipidemia Maternal Aunt   . Depression Maternal Aunt   . Bipolar disorder Maternal Aunt   . Hypertension Maternal Aunt   . Hypertension Maternal Grandfather   . Heart disease Paternal Grandfather    No current facility-administered medications on file prior to encounter.   Current Outpatient Medications on File Prior to Encounter  Medication Sig Dispense Refill  . Ascorbic Acid (VITAMIN C) 1000 MG tablet Take 500 mg by mouth daily.     .  Docosahexaenoic Acid (PRENATAL DHA) 200 MG CAPS Take by mouth.    . Doxylamine Succinate, Sleep, (UNISOM SLEEPTABS PO) Take by mouth.    . famotidine (PEPCID) 20 MG tablet Take 20 mg by mouth 2 (two) times daily.    . Prenatal Vit-Fe Fumarate-FA (PRENATAL MULTIVITAMIN) TABS tablet Take 1 tablet by mouth daily at 12 noon.    Marland Kitchen aspirin EC 81 MG tablet Take 81 mg by mouth daily.    . cholecalciferol (VITAMIN D3) 25 MCG (1000 UNIT) tablet Take 1,000 Units by mouth daily.    . progesterone (PROMETRIUM) 200 MG capsule Take 1 capsule (200 mg total) by mouth at bedtime. 30 capsule 4   No Known Allergies  I have reviewed patient's Past Medical Hx, Surgical Hx, Family Hx, Social Hx, medications and allergies.   Review of Systems  Constitutional: Negative.   Gastrointestinal: Negative.   Genitourinary: Positive for vaginal bleeding. Negative for dysuria and vaginal discharge.    OBJECTIVE Patient Vitals for the past 24 hrs:  BP Temp Temp src Pulse Resp SpO2 Height Weight  08/04/20 1819 115/77 98.5 F (36.9 C) Oral (!) 112 20 100 % 5\' 7"  (1.702 m) 71.5 kg   Constitutional: Well-developed, well-nourished female in no acute distress.  Cardiovascular: normal rate & rhythm, no murmur Respiratory: normal rate and effort. Lung sounds clear throughout GI: Abd soft, non-tender, Pos BS x 4. No guarding or rebound tenderness MS: Extremities nontender, no edema, normal ROM Neurologic: Alert and oriented x 4.  GU:     SPECULUM EXAM: NEFG, scan amount of brown mucoid blood at os. No active bleeding. Cervix not friable.   BIMANUAL: cervix closed/long/firm   LAB RESULTS Results for orders placed or performed during the hospital encounter of 08/04/20 (from the past 24 hour(s))  Rh IG workup (includes ABO/Rh)     Status: None (Preliminary result)   Collection Time: 08/04/20  6:51 PM  Result Value Ref Range   Gestational Age(Wks) 15    ABO/RH(D)      O NEG Performed at [facility], [address] [place], [place]    Antibody Screen PENDING     IMAGING No results found.  MAU COURSE Orders Placed This Encounter  Procedures  . Rh IG workup (includes ABO/Rh)  . Discharge patient   Meds ordered this encounter  Medications  . rho (d)  immune globulin (RHIG/RHOPHYLAC) injection 300 mcg    MDM FHT present via doppler Pt with no pain No active bleeding on exam. Scant brown mucoid discharge. Cervix closed/long.   RH negative. Rhogam to be given while in MAU today  ASSESSMENT 1. Vaginal bleeding in pregnancy, second trimester   2. [redacted] weeks gestation of pregnancy   3. Rh negative state in antepartum period, second trimester     PLAN Discharge home in stable condition. Bleeding precautions    Follow-up Information    [facility].   Specialty: Obstetrics and Gynecology Contact information: [address] [place] [address] [phone]              Allergies as of 08/04/2020   No Known Allergies     Medication List    STOP taking these medications   aspirin EC 81 MG tablet   cholecalciferol 25 MCG (1000 UNIT) tablet Commonly known as: VITAMIN D3   progesterone 200 MG capsule Commonly known as: Prometrium     TAKE these medications   famotidine 20 MG tablet Commonly known as: PEPCID Take 20 mg by mouth 2 (two) times daily.   PreNatal DHA 200 MG Caps Generic drug: Docosahexaenoic Acid Take by mouth.   prenatal multivitamin Tabs tablet Take 1 tablet by mouth daily at 12 noon.   UNISOM SLEEPTABS PO Take by mouth.   vitamin C 1000 MG tablet Take 500 mg by mouth daily.        Judeth Horn, NP 08/04/2020  7:28 PM

## 2020-08-04 NOTE — MAU Note (Signed)
Just went to the bathroom about an hour ago, strained, when she wiped there was blood, and a dime sized clot. No pain.

## 2020-08-04 NOTE — Discharge Instructions (Signed)
Vaginal Bleeding During Pregnancy, Second Trimester ° °A small amount of bleeding (spotting) from the vagina is relatively common during pregnancy. It usually stops on its own. Various things can cause spotting during pregnancy. Sometimes the bleeding is normal and is not a sign of a problem in the pregnancy. However, bleeding can also be a sign of something serious. Be sure to tell your health care provider about any vaginal bleeding right away. °Some possible causes of vaginal bleeding during the second trimester include: °· Infection, inflammation, or growths (polyps) on the cervix. °· A condition in which the placenta partially or completely covers the opening of the cervix inside the uterus (placenta previa). °· The placenta separating from the uterus (placenta abruption). °· Early (preterm) labor. °· The cervix opening and thinning before pregnancy is at term and before labor starts (cervical insufficiency). °· A mass of tissue developing in the uterus due to an egg being fertilized incorrectly (molar pregnancy). °Follow these instructions at home: °Activity °· Follow instructions from your health care provider about limiting your activity. Ask what activities are safe for you. °· If needed, make plans for someone to help with your regular activities. °· Do not exercise or do activities that take a lot of effort unless your health care provider approves. °· Do not lift anything that is heavier than 10 lb (4.5 kg), or the limit that your health care provider tells you, until he or she says that it is safe. °· Do not have sex or orgasms until your health care provider says that this is safe. °Medicines °· Take over-the-counter and prescription medicines only as told by your health care provider. °· Do not take aspirin because it can cause bleeding. °General instructions °· Pay attention to any changes in your symptoms. °· Write down how many pads you use each day, how often you change pads, and how soaked  (saturated) they are. °· Do not use tampons or douche. °· If you pass any tissue from your vagina, save the tissue so you can show it to your health care provider. °· Keep all follow-up visits as told by your health care provider. This is important. °Contact a health care provider if: °· You have vaginal bleeding during any time of your pregnancy. °· You have cramps or labor pains. °· You have a fever that does not get better when you take medicines. °Get help right away if: °· You have severe cramps in your back or abdomen. °· You have contractions. °· You have chills. °· You pass large clots or a large amount of tissue from your vagina. °· Your bleeding increases. °· You feel light-headed or weak, or you faint. °· You are leaking fluid or have a gush of fluid from your vagina. °Summary °· Various things can cause bleeding or spotting in pregnancy. °· Be sure to tell your health care provider about any vaginal bleeding right away. °· Follow instructions from your health care provider about limiting your activity. Ask what activities are safe for you. °This information is not intended to replace advice given to you by your health care provider. Make sure you discuss any questions you have with your health care provider. °Document Revised: 04/06/2019 Document Reviewed: 03/20/2017 °Elsevier Patient Education © 2020 Elsevier Inc. ° °

## 2020-08-06 LAB — RH IG WORKUP (INCLUDES ABO/RH)
ABO/RH(D): O NEG
Antibody Screen: NEGATIVE
Gestational Age(Wks): 15
Unit division: 0

## 2020-08-31 ENCOUNTER — Other Ambulatory Visit: Payer: Self-pay | Admitting: Obstetrics & Gynecology

## 2020-08-31 DIAGNOSIS — Z363 Encounter for antenatal screening for malformations: Secondary | ICD-10-CM

## 2020-09-08 ENCOUNTER — Other Ambulatory Visit: Payer: Self-pay | Admitting: *Deleted

## 2020-09-08 ENCOUNTER — Other Ambulatory Visit: Payer: Self-pay

## 2020-09-08 ENCOUNTER — Ambulatory Visit: Payer: Managed Care, Other (non HMO) | Attending: Obstetrics and Gynecology

## 2020-09-08 ENCOUNTER — Encounter: Payer: Self-pay | Admitting: *Deleted

## 2020-09-08 ENCOUNTER — Ambulatory Visit: Payer: Managed Care, Other (non HMO) | Admitting: *Deleted

## 2020-09-08 VITALS — BP 121/69 | HR 118

## 2020-09-08 DIAGNOSIS — O09529 Supervision of elderly multigravida, unspecified trimester: Secondary | ICD-10-CM | POA: Insufficient documentation

## 2020-09-08 DIAGNOSIS — Z363 Encounter for antenatal screening for malformations: Secondary | ICD-10-CM | POA: Insufficient documentation

## 2020-09-11 ENCOUNTER — Other Ambulatory Visit: Payer: Self-pay

## 2020-09-28 ENCOUNTER — Inpatient Hospital Stay (HOSPITAL_COMMUNITY)
Admission: AD | Admit: 2020-09-28 | Discharge: 2020-09-28 | Disposition: A | Discharge status: Home / Self Care | Payer: Managed Care, Other (non HMO) | Attending: Obstetrics & Gynecology | Admitting: Obstetrics & Gynecology

## 2020-09-28 ENCOUNTER — Encounter (HOSPITAL_COMMUNITY): Payer: Self-pay | Admitting: Obstetrics & Gynecology

## 2020-09-28 ENCOUNTER — Other Ambulatory Visit: Payer: Self-pay

## 2020-09-28 DIAGNOSIS — O0912 Supervision of pregnancy with history of ectopic or molar pregnancy, second trimester: Secondary | ICD-10-CM | POA: Diagnosis not present

## 2020-09-28 DIAGNOSIS — O09522 Supervision of elderly multigravida, second trimester: Secondary | ICD-10-CM | POA: Insufficient documentation

## 2020-09-28 DIAGNOSIS — Z79899 Other long term (current) drug therapy: Secondary | ICD-10-CM | POA: Diagnosis not present

## 2020-09-28 DIAGNOSIS — O9A212 Injury, poisoning and certain other consequences of external causes complicating pregnancy, second trimester: Secondary | ICD-10-CM | POA: Diagnosis present

## 2020-09-28 DIAGNOSIS — O99342 Other mental disorders complicating pregnancy, second trimester: Secondary | ICD-10-CM | POA: Diagnosis not present

## 2020-09-28 DIAGNOSIS — F329 Major depressive disorder, single episode, unspecified: Secondary | ICD-10-CM | POA: Diagnosis not present

## 2020-09-28 DIAGNOSIS — Z3A23 23 weeks gestation of pregnancy: Secondary | ICD-10-CM

## 2020-09-28 DIAGNOSIS — W010XXA Fall on same level from slipping, tripping and stumbling without subsequent striking against object, initial encounter: Secondary | ICD-10-CM | POA: Diagnosis not present

## 2020-09-28 NOTE — MAU Provider Note (Signed)
History     CSN: [id number]  Arrival date and time: 09/28/20 0177   First Provider Initiated Contact with Patient 09/28/20 1850      Chief Complaint  Patient presents with  . Fall   Erica Estes is a 39 y.o. [id number] at [redacted]w[redacted]d who presents after a fall. Incident occurred at 430 pm today. States she rolled her ankle when walking away from her car and fell into the grass. Landed on her hands & knees initially then rolled to her side. Denies abdominal pain, LOF, or vaginal bleeding. Did not hit her abdomen. Denies any other pain. Positive fetal movement.    Past Medical History:  Diagnosis Date  . Abnormal Pap smear   . Anxiety   . Compression fracture of T12 vertebra (HCC) 2000  . Depression    Wellbutrin 150 mg daily  . Ectopic pregnancy 2011   no tx needed, "It absorbed itself"  . GERD (gastroesophageal reflux disease)   . Headache   . IBS (irritable bowel syndrome)   . 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 Molar pregnancy   . Vaginal Pap smear, abnormal     Past Surgical History:  Procedure Laterality Date  . COLPOSCOPY  2005  . DIAGNOSTIC LAPAROSCOPY WITH REMOVAL OF ECTOPIC PREGNANCY N/A 10/20/2019   Procedure: SUCTION DILATION AND EVACUATION WITH CERVICAL BLOCK;  Surgeon: Genia Del, MD;  Location: [facility];  Service: Gynecology;  Laterality: N/A;  . DILATION AND CURETTAGE OF UTERUS    . LAPAROSCOPY  2000   Not done, unable to remove from EPic  . TONSILLECTOMY  12/98  . TONSILLECTOMY AND ADENOIDECTOMY  12/30/1996  . WISDOM TOOTH EXTRACTION  12/30/2002    Family History  Problem Relation Age of Onset  . Hyperlipidemia Mother   . Hypertension Mother        controlled by diet  . Cancer Father 40       malignant  . Hyperlipidemia Maternal Aunt   . Depression Maternal Aunt   . Bipolar disorder Maternal Aunt   . Hypertension Maternal Aunt   . Hypertension Maternal Grandfather   . Heart disease Paternal Grandfather     Social History   Tobacco  Use  . Smoking status: Never Smoker  . Smokeless tobacco: Never Used  Vaping Use  . Vaping Use: Never used  Substance Use Topics  . Alcohol use: Not Currently    Comment: Socially  . Drug use: No    Allergies: No Known Allergies  Medications Prior to Admission  Medication Sig Dispense Refill Last Dose  . Ascorbic Acid (VITAMIN C) 1000 MG tablet Take 500 mg by mouth daily.    09/27/2020 at Unknown time  . Docosahexaenoic Acid (PRENATAL DHA) 200 MG CAPS Take by mouth.   09/28/2020 at 1100  . famotidine (PEPCID) 20 MG tablet Take 20 mg by mouth 2 (two) times daily. (Patient not taking: Reported on 09/08/2020)       Review of Systems  Constitutional: Negative.   Gastrointestinal: Negative.   Genitourinary: Negative.    Physical Exam   Blood pressure 124/76, pulse 87, temperature 98.1 F (36.7 C), temperature source Oral, resp. rate 20, height 5\' 7"  (1.702 m), weight 75.8 kg, last menstrual period 04/20/2020, SpO2 100 %, unknown if currently breastfeeding.  Physical Exam Vitals and nursing note reviewed.  Constitutional:      General: She is not in acute distress.    Appearance: Normal appearance. She is normal weight.  HENT:  Head: Normocephalic and atraumatic.  Pulmonary:     Effort: Pulmonary effort is normal. No respiratory distress.  Abdominal:     Palpations: Abdomen is soft.     Tenderness: There is no abdominal tenderness.  Neurological:     Mental Status: She is alert.  Psychiatric:        Mood and Affect: Mood normal.        Behavior: Behavior normal.        Thought Content: Thought content normal.    Fetal Tracing:  Baseline: 125 Variability: moderate Accelerations: 10x10 Decelerations: none  Toco: none   MAU Course  Procedures  MDM Prolonged monitoring in MAU. Fetal tracing appropriate for gestational age. No contractions on toco. Abdomen soft. No pain or bleeding. Did not impact abdomen during fall.   Assessment and Plan   1. Fall due to  stumbling, initial encounter   2. Traumatic injury during pregnancy in second trimester   3. [redacted] weeks gestation of pregnancy    -Reassuring tracing tonight. No pain, bleeding, or leaking. Pt to f/u with her ob as scheduled. Reviewed reasons to return to MAU  Judeth Horn 09/28/2020, 6:50 PM

## 2020-09-28 NOTE — Discharge Instructions (Signed)
Preventing Injuries During Pregnancy Trauma is the most common cause of injury and death in pregnant women. This can also result in serious harm to the baby or even death. How can injuries affect my pregnancy? Your baby is protected in the womb (uterus) by a sac filled with fluid (amniotic sac). Your baby can be harmed if there is a direct blow to your abdomen and pelvis. Trauma may be caused by:  Falls. These are more common in the second and third trimester of pregnancy.  Automobile accidents.  Domestic violence or assault.  Severe burns, such as from fire or electricity. These injuries can result in:  Tearing of your uterus.  The placenta pulling away from the wall of the uterus (placental abruption).  The amniotic sac breaking open (rupture of membranes).  Blockage or decrease in the blood supply to your baby.  Going into labor earlier than expected.  Severe injuries to other parts of your body, such as your brain, spine, heart, lungs, or other organs. Minor falls and low-impact automobile accidents do not usually harm your baby, even if they cause a little harm to you. What can I do to lower my risk? Safety  Remove slippery rugs and loose objects on the floor. They increase your risk of tripping or slipping.  Wear comfortable shoes that have a good grip on the sole. Do not wear high-heeled shoes.  Always wear your seat belt properly when riding in a car. Use both the lap and shoulder belt, with the lap belt below your abdomen. Always practice safe driving. Do not ride on a motorcycle while pregnant. Activity  Avoid walking on wet or slippery floors.  Do not participate in rough and violent activities or sports.  Avoid high-risk situations and activities such as: ? Lifting heavy pots of boiling or hot liquids. ? Fixing electrical problems. ? Being near fires or starting fires. General instructions  Take over-the-counter and prescription medicines only as told by your  health care provider.  Know your blood type and the father's blood type in case you develop vaginal bleeding or experience an injury for which a blood transfusion is needed.  Spousal abuse can be a serious cause of trauma during pregnancy. If you are a victim of domestic violence or assault: ? Call your local emergency services (911 in the [place] ? Contact the National Domestic Violence Hotline for help and support. When should I seek immediate medical care? Get help right away if:  You fall on your abdomen or experience any serious blow to your abdomen.  You develop stiffness in your neck or pain after a fall or from other trauma.  You develop a headache or vision problems after a fall or from other trauma.  You do not feel the baby moving after a fall or trauma, or you feel that the baby is not moving as much as before the fall or trauma.  You have been the victim of domestic violence or any other kind of physical attack.  You have been in a car accident.  You develop vaginal bleeding.  You have fluid leaking from the vagina.  You develop uterine contractions. Symptoms include pelvic cramping, pain, or serious low back pain.  You become weak, faint, or have uncontrolled vomiting after trauma.  You have a serious burn. This includes burns to the face, neck, hands, or genitals, or burns greater than the size of your palm anywhere else. Summary  Trauma is the most common cause of injury and death   in pregnant women and can also lead to injury or death of the baby.  Falls, automobile accidents, domestic violence or assault, and severe burns can injure you or your baby. Make sure to get medical help right away if you experience any of these during your pregnancy.  Take steps to prevent slips or falls in your home, such as avoiding slippery floors and removing loose rugs.  Always wear your seat belt properly when riding in a car. Practice safe driving. This information is not  intended to replace advice given to you by your health care provider. Make sure you discuss any questions you have with your health care provider. Document Revised: 04/15/2019 Document Reviewed: 12/25/2016 Elsevier Patient Education  2020 Elsevier Inc.  

## 2020-09-28 NOTE — MAU Note (Signed)
Presents with c/o of fall @ 1630 this afternoon.  Reports didn't strike abdomen, fell on hands and knees.  Denies VB or LOF.  Endorses +FM since fall.

## 2020-10-05 ENCOUNTER — Ambulatory Visit: Payer: Managed Care, Other (non HMO) | Attending: Obstetrics and Gynecology

## 2020-10-05 ENCOUNTER — Ambulatory Visit: Payer: Managed Care, Other (non HMO) | Admitting: *Deleted

## 2020-10-05 ENCOUNTER — Other Ambulatory Visit: Payer: Self-pay

## 2020-10-05 VITALS — BP 115/74 | HR 87

## 2020-10-05 DIAGNOSIS — Z3A24 24 weeks gestation of pregnancy: Secondary | ICD-10-CM | POA: Diagnosis not present

## 2020-10-05 DIAGNOSIS — O09529 Supervision of elderly multigravida, unspecified trimester: Secondary | ICD-10-CM | POA: Diagnosis not present

## 2020-10-05 DIAGNOSIS — Z362 Encounter for other antenatal screening follow-up: Secondary | ICD-10-CM | POA: Diagnosis not present

## 2020-10-05 DIAGNOSIS — O09522 Supervision of elderly multigravida, second trimester: Secondary | ICD-10-CM | POA: Diagnosis not present

## 2020-12-07 IMAGING — US US OB TRANSVAGINAL
1 of 2 series · 13 of 28 positions shown · non-contrast
Comparison: None.

CLINICAL DATA: Severe abdominal pain.  Nausea, and dizziness.

EXAM:
OBSTETRIC <14 WK US AND TRANSVAGINAL OB US
TECHNIQUE: Both transabdominal and transvaginal ultrasound examinations were
performed for complete evaluation of the gestation as well as the
maternal uterus, adnexal regions, and pelvic cul-de-sac.
Transvaginal technique was performed to assess early pregnancy.

[Series 1: us ob transvaginal · 13 of 101 slices shown]
[im 4/101]
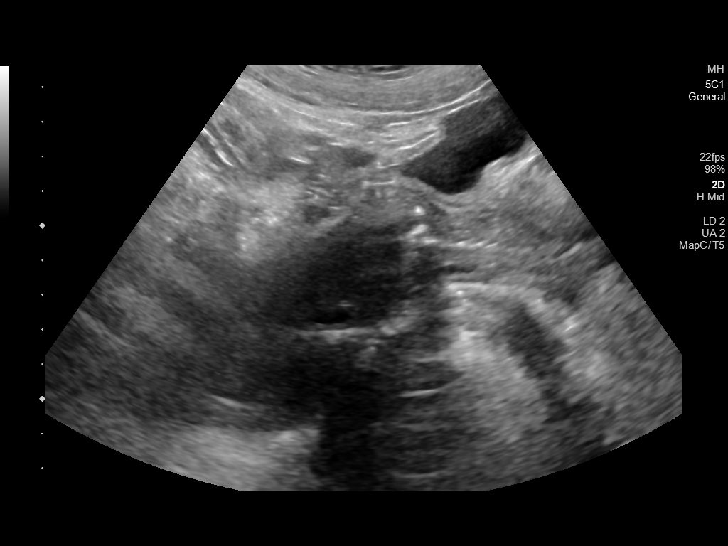
[im 12/101]
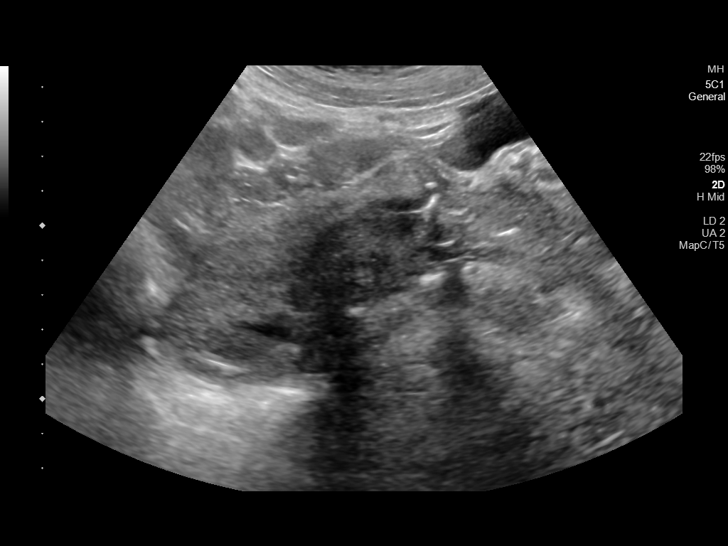
[im 20/101]
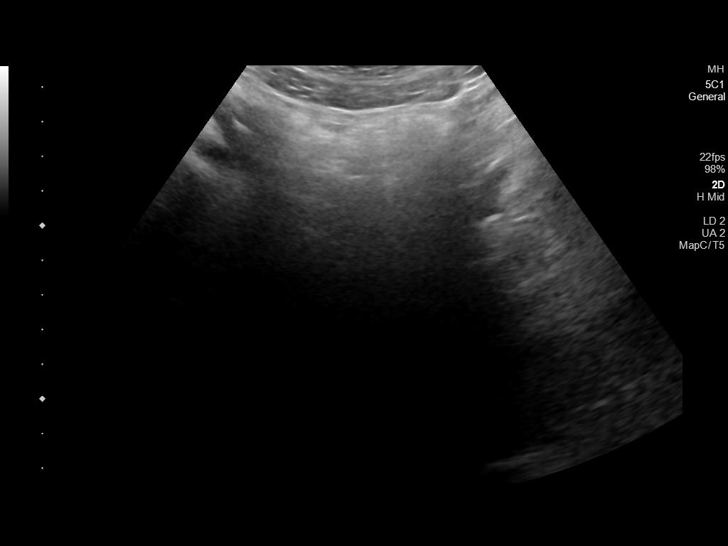
[im 27/101]
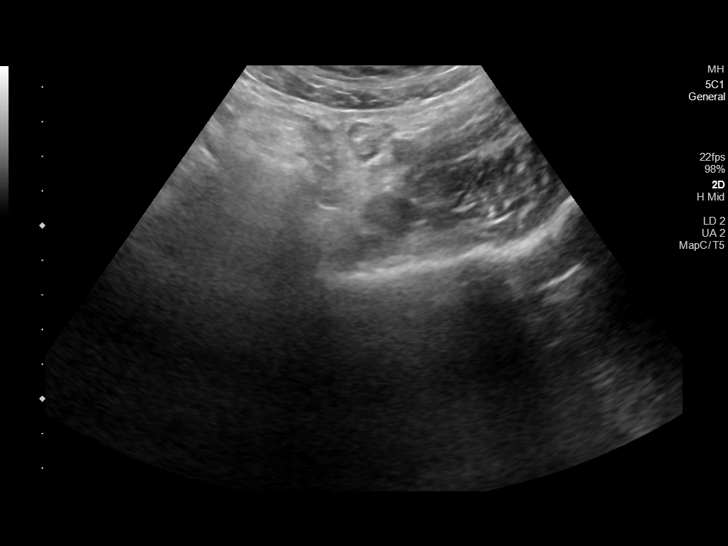
[im 35/101]
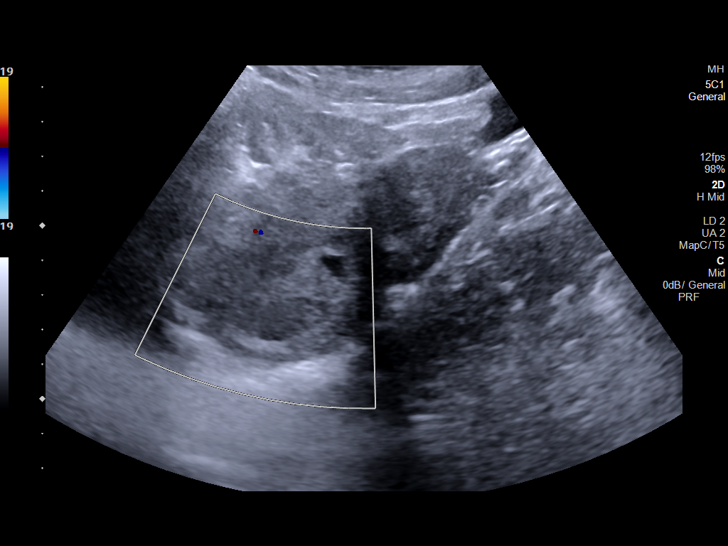
[im 43/101]
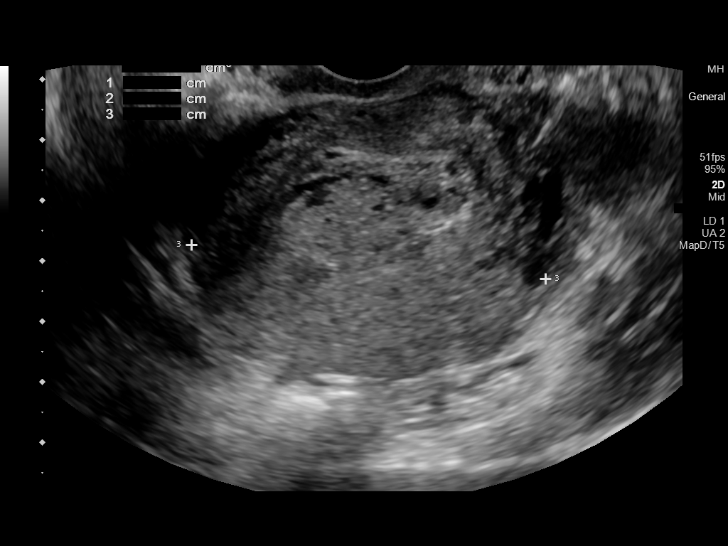
[im 54/101]
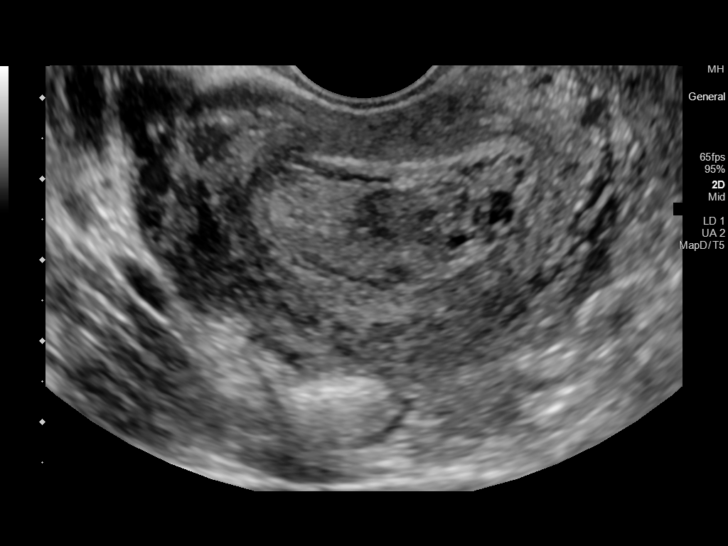
[im 62/101]
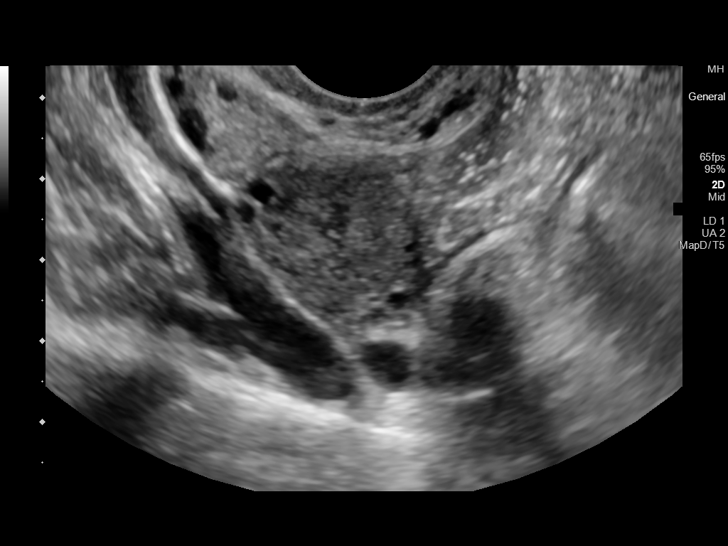
[im 70/101]
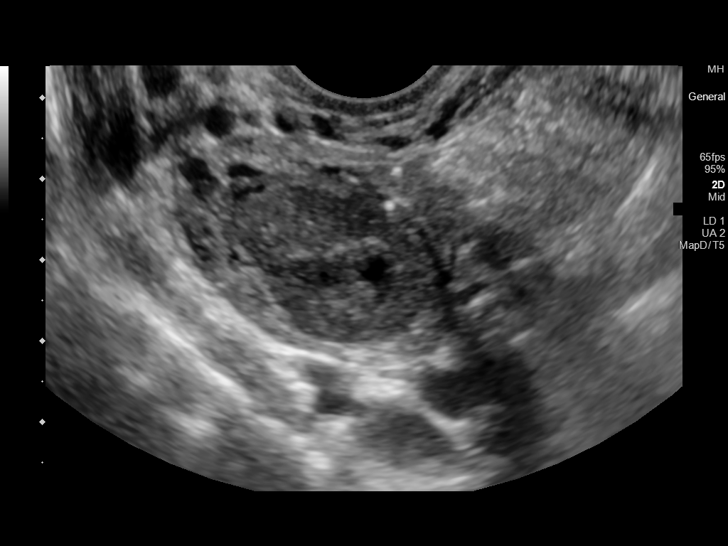
[im 77/101]
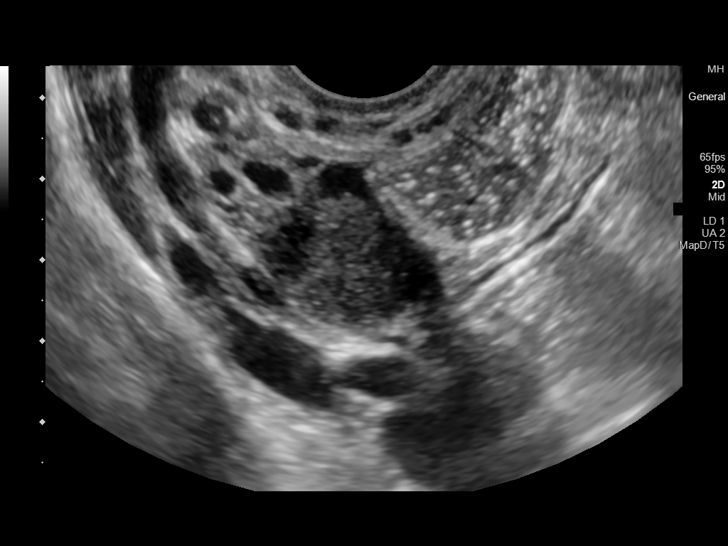
[im 85/101]
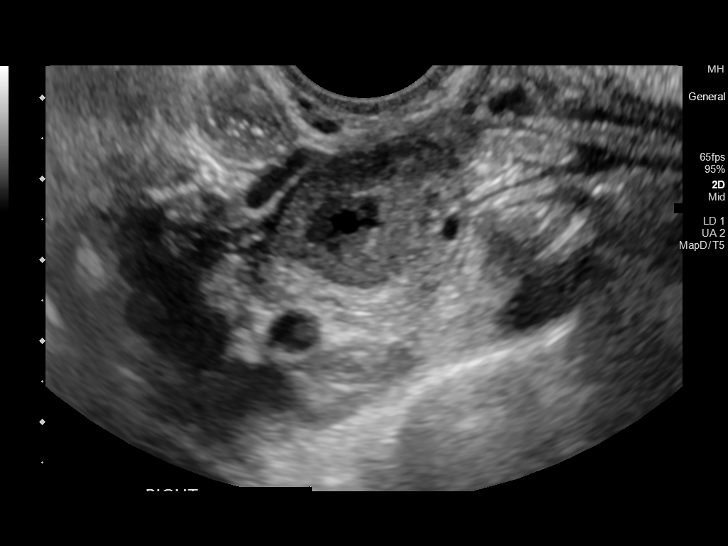
[im 93/101]
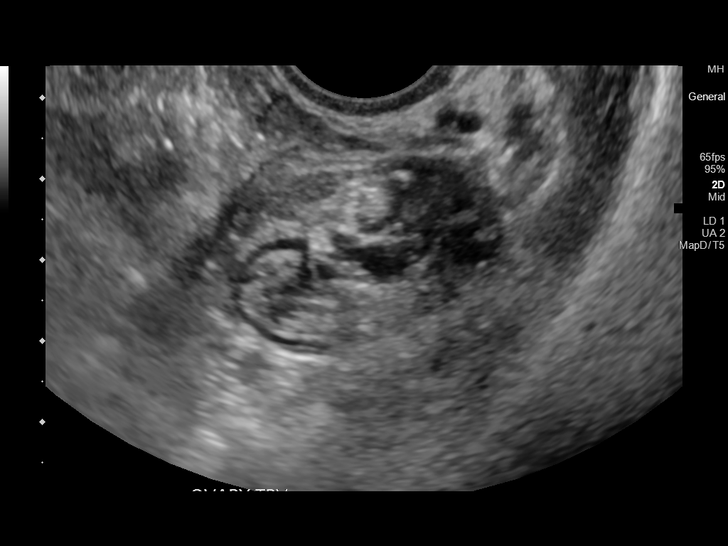
[im 101/101]
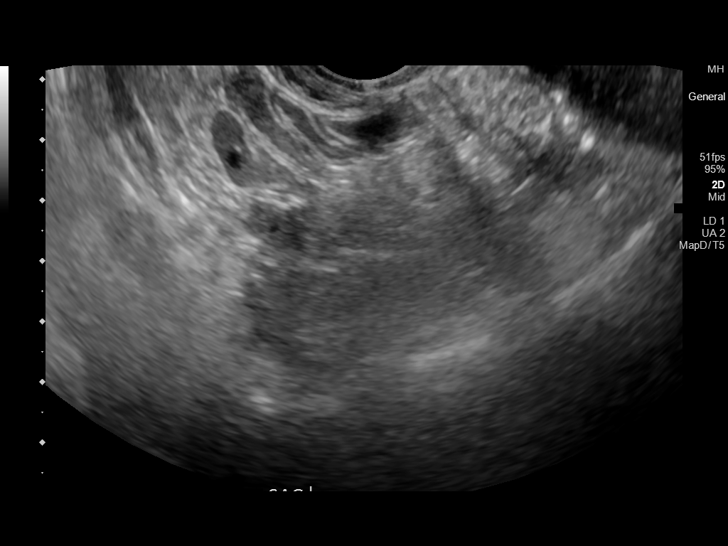

[13 of 28 positions shown; findings below may reference images not displayed]

FINDINGS: Intrauterine gestational sac: None

Maternal uterus/adnexae: The endometrial cavity is distended by
heterogeneous material which shows internal cystic foci as well as
internal blood flow on color Doppler ultrasound. This is suspicious
for gestational trophoblastic neoplasia. No fibroids identified.

The right ovary contains a small corpus luteum cyst. The left ovary
is normal appearance. No adnexal mass or abnormal free fluid
identified.
IMPRESSION: No intrauterine gestational sac or adnexal mass identified.

Large amount of heterogeneous vascular soft tissue density within
the endometrial cavity, highly suspicious for molar pregnancy.

## 2020-12-30 NOTE — L&D Delivery Note (Signed)
Delivery Note Labor onset: 01/22/2021  Labor Onset Time: 1730 Complete dilation at  0135 Onset of pushing at 0139 FHR second stage Cat 1 Analgesia/Anesthesia intrapartum: None  Voluntary pushing with maternal urge on hands and knees. Delivery of a viable female at 11. Fetal head delivered in LOA position. Nuchal cord none. Infant placed on maternal abd, dried, and tactile stim.  Cord double clamped after 7 min and cut by Father. Fatherpresent for birth. Cord blood sample collected Yes Arterial cord blood sample N/A.  Placenta delivered Tomasa Blase, intact, with 3 VC.  Placenta to L&D. Uterine tone firm, bleeding scant  1st degree laceration identified, hemostatic.  Anesthesia: N/A Repair: No EBL (mL): 350 Complications: none APGAR: APGAR (1 MIN): 9   APGAR (5 MINS): 9   APGAR (10 MINS):   Mom to postpartum.  Baby to Couplet care / Skin to Skin.  Request in-pt circ.   Roma Schanz MSN, CNM 01/23/2021, 2:20 AM

## 2021-01-02 LAB — OB RESULTS CONSOLE GBS: GBS: NEGATIVE

## 2021-01-22 ENCOUNTER — Inpatient Hospital Stay (HOSPITAL_COMMUNITY)
Admission: AD | Admit: 2021-01-22 | Discharge: 2021-01-25 | DRG: 807 | Disposition: A | Discharge status: Home / Self Care | Payer: Managed Care, Other (non HMO) | Attending: Obstetrics and Gynecology | Admitting: Obstetrics and Gynecology

## 2021-01-22 ENCOUNTER — Encounter (HOSPITAL_COMMUNITY): Payer: Self-pay | Admitting: Obstetrics & Gynecology

## 2021-01-22 DIAGNOSIS — O99344 Other mental disorders complicating childbirth: Secondary | ICD-10-CM | POA: Diagnosis present

## 2021-01-22 DIAGNOSIS — Z3A39 39 weeks gestation of pregnancy: Secondary | ICD-10-CM | POA: Diagnosis not present

## 2021-01-22 DIAGNOSIS — Z20822 Contact with and (suspected) exposure to covid-19: Secondary | ICD-10-CM | POA: Diagnosis present

## 2021-01-22 DIAGNOSIS — Z6791 Unspecified blood type, Rh negative: Secondary | ICD-10-CM | POA: Diagnosis not present

## 2021-01-22 DIAGNOSIS — F32A Depression, unspecified: Secondary | ICD-10-CM | POA: Diagnosis present

## 2021-01-22 DIAGNOSIS — O26893 Other specified pregnancy related conditions, third trimester: Principal | ICD-10-CM | POA: Diagnosis present

## 2021-01-22 LAB — CBC
HCT: 35.8 % — ABNORMAL LOW (ref 36.0–46.0)
Hemoglobin: 13 g/dL (ref 12.0–15.0)
MCH: 31.8 pg (ref 26.0–34.0)
MCHC: 36.3 g/dL — ABNORMAL HIGH (ref 30.0–36.0)
MCV: 87.5 fL (ref 80.0–100.0)
Platelets: 186 10*3/uL (ref 150–400)
RBC: 4.09 MIL/uL (ref 3.87–5.11)
RDW: 12.5 % (ref 11.5–15.5)
WBC: 14.9 10*3/uL — ABNORMAL HIGH (ref 4.0–10.5)
nRBC: 0 % (ref 0.0–0.2)

## 2021-01-22 MED ORDER — LACTATED RINGERS IV SOLN: 500.0000 mL | INTRAVENOUS | Status: DC | PRN

## 2021-01-22 MED ORDER — LIDOCAINE HCL (PF) 1 % IJ SOLN: 30.0000 mL | INTRAMUSCULAR | Status: DC | PRN

## 2021-01-22 MED ORDER — ACETAMINOPHEN 325 MG PO TABS: 650.0000 mg | ORAL_TABLET | ORAL | Status: DC | PRN

## 2021-01-22 MED ORDER — LACTATED RINGERS IV SOLN: INTRAVENOUS | Status: DC

## 2021-01-22 MED ORDER — ONDANSETRON HCL 4 MG/2ML IJ SOLN: 4.0000 mg | Freq: Four times a day (QID) | INTRAMUSCULAR | Status: DC | PRN

## 2021-01-22 MED ORDER — OXYTOCIN-SODIUM CHLORIDE 30-0.9 UT/500ML-% IV SOLN
2.5000 [IU]/h | INTRAVENOUS | Status: DC
  Filled 2021-01-22: qty 500

## 2021-01-22 MED ORDER — OXYTOCIN BOLUS FROM INFUSION: 333.0000 mL | Freq: Once | INTRAVENOUS | Status: DC

## 2021-01-22 MED ORDER — FENTANYL CITRATE (PF) 100 MCG/2ML IJ SOLN: 50.0000 ug | INTRAMUSCULAR | Status: DC | PRN

## 2021-01-22 MED ORDER — SOD CITRATE-CITRIC ACID 500-334 MG/5ML PO SOLN
30.0000 mL | ORAL | Status: DC | PRN
  Administered 2021-01-23: 30 mL via ORAL
  Filled 2021-01-22: qty 15

## 2021-01-22 NOTE — H&P (Signed)
OB ADMISSION/ HISTORY & PHYSICAL:  Admission Date: 01/22/2021 10:30 PM  Admit Diagnosis: Normal labor  Erica Estes is a 40 y.o. female [id number] [redacted]w[redacted]d presenting for labor eval. Endorses active FM, denies LOF and vaginal bleeding. Ctx began @ 1830 and have progressed in intensity and frequency. Prev SVD x2, uncomplicated, desires unmedicated labor and requests intermittent monitoring to freedom of movement.   History of current pregnancy: [id number]   Patient entered care with [facility] at 14+5 wks.   EDC 01/25/21 by LMP and congruent w/ 6+5 wk U/S.   Anatomy scan:  20+2, limited anatomy of heart. Repeated @ 24+5, complete w/ anterior placenta  Significant prenatal events:  Patient Active Problem List   Diagnosis Date Noted  . Normal labor 01/22/2021  . Maternal age 15+, multigravida, antepartum 07/28/2020  . History of ectopic pregnancy 07/04/2020  . History of migraine 07/04/2020  . Depressive disorder 07/04/2020  . Anxiety 07/04/2020  . Irritable bowel syndrome 08/31/2016  . Rh negative status during pregnancy 04/05/2015  . History of depression 04/05/2015    Prenatal Labs: ABO, Rh: --/--/O NEG (08/06 1851) Antibody: NEG (08/06 1851) Rubella:   immune RPR:   NR HBsAg:   NR HIV:   NR GTT: passed 1 hr GBS: Negative/-- (01/04 0000)  GC/CHL: neg/neg Genetics: low-risk Tdap/influenza vaccines: UTD tdap, declines flu   OB History  Gravida Para Term Preterm AB Living  5 2 2  0 2 2  SAB IAB Ectopic Multiple Live Births  0 0 1 0 2    # Outcome Date GA Lbr Len/2nd Weight Sex Delivery Anes PTL Lv  5 Current           4 Molar 10/20/19          3 Term 04/04/15 [redacted]w[redacted]d 15:05 / 00:25 3620 g M Vag-Spont EPI  LIV  2 Term 12/29/12 [redacted]w[redacted]d 23:16 / 00:43 3147 g M Vag-Spont EPI  LIV  1 Ectopic 2011            Medical / Surgical History: Past medical history:  Past Medical History:  Diagnosis Date  . Abnormal Pap smear   . Anxiety   . Compression fracture of T12 vertebra (HCC) 2000  .  Depression    Wellbutrin 150 mg daily  . Ectopic pregnancy 2011   no tx needed, "It absorbed itself"  . GERD (gastroesophageal reflux disease)   . Headache   . IBS (irritable bowel syndrome)   . 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 Molar pregnancy   . Vaginal Pap smear, abnormal     Past surgical history:  Past Surgical History:  Procedure Laterality Date  . COLPOSCOPY  2005  . DIAGNOSTIC LAPAROSCOPY WITH REMOVAL OF ECTOPIC PREGNANCY N/A 10/20/2019   Procedure: SUCTION DILATION AND EVACUATION WITH CERVICAL BLOCK;  Surgeon: 10/22/2019, MD;  Location: [facility];  Service: Gynecology;  Laterality: N/A;  . DILATION AND CURETTAGE OF UTERUS    . LAPAROSCOPY  2000   Not done, unable to remove from EPic  . TONSILLECTOMY  12/98  . TONSILLECTOMY AND ADENOIDECTOMY  12/30/1996  . WISDOM TOOTH EXTRACTION  12/30/2002   Family History:  Family History  Problem Relation Age of Onset  . Hyperlipidemia Mother   . Hypertension Mother        controlled by diet  . Cancer Father 40       malignant  . Hyperlipidemia Maternal Aunt   . Depression Maternal Aunt   . Bipolar disorder Maternal Aunt   .  Hypertension Maternal Aunt   . Hypertension Maternal Grandfather   . Heart disease Paternal Grandfather     Social History:  reports that she has never smoked. She has never used smokeless tobacco. She reports previous alcohol use. She reports that she does not use drugs.  Allergies: Patient has no known allergies.   Current Medications at time of admission:  Prior to Admission medications   Medication Sig Start Date End Date Taking? Authorizing Provider  Ascorbic Acid (VITAMIN C) 1000 MG tablet Take 500 mg by mouth daily.  Patient not taking: Reported on 10/05/2020    [provider]  Docosahexaenoic Acid (PRENATAL DHA) 200 MG CAPS Take by mouth.    [provider]    Review of Systems: Constitutional: Negative   HENT: Negative   Eyes: Negative   Respiratory:  Negative   Cardiovascular: Negative   Gastrointestinal: Negative  Genitourinary: neg for bloody show, neg for LOF   Musculoskeletal: Negative   Skin: Negative   Neurological: Negative   Endo/Heme/Allergies: Negative   Psychiatric/Behavioral: Negative    Physical Exam: VS: Blood pressure 129/88, pulse 94, temperature 98.9 F (37.2 C), temperature source Oral, resp. rate 18, height 5\' 7"  (1.702 m), weight 84.8 kg, last menstrual period 04/20/2020, unknown if currently breastfeeding. AAO x3, no signs of distress Cardiovascular: RRR Respiratory: Lung fields clear to ausculation GU/GI: Abdomen gravid, non-tender, non-distended, active FM, vertex Extremities: no edema, negative for pain, tenderness, and cords  Cervical exam:Dilation: 4.5 Effacement (%): 80 Station: -1 Exam by:: K.Wilson,RN FHR: baseline rate 115 / variability moderate / accelerations present / absent decelerations TOCO: 2-3   Prenatal Transfer Tool  Maternal Diabetes: No Genetic Screening: Normal Maternal Ultrasounds/Referrals: Normal Fetal Ultrasounds or other Referrals:  None Maternal Substance Abuse:  No Significant Maternal Medications:  None Significant Maternal Lab Results: Group B Strep negative    Assessment: 40 y.o. 30 [redacted]w[redacted]d Normal labor AMA RH neg     -Rhogam @ 30 wks Latent stage of labor FHR category 1 GBS neg Pain management plan: per pt request   Plan:  Admit to L&D Routine admission orders Epidural PRN Desires minimal intervention      Dr [redacted]w[redacted]d notified of admission and plan of care  Su Hilt MSN, CNM 01/22/2021 11:17 PM

## 2021-01-22 NOTE — MAU Note (Signed)
Pt reprots ctx since 5:30 PM. About 3-5 min apart. No bleeding or leaking. Good fetal felt.

## 2021-01-23 ENCOUNTER — Encounter (HOSPITAL_COMMUNITY): Payer: Self-pay | Admitting: Obstetrics and Gynecology

## 2021-01-23 ENCOUNTER — Other Ambulatory Visit: Payer: Self-pay

## 2021-01-23 LAB — CBC
HCT: 38 % (ref 36.0–46.0)
Hemoglobin: 12.8 g/dL (ref 12.0–15.0)
MCH: 30.2 pg (ref 26.0–34.0)
MCHC: 33.7 g/dL (ref 30.0–36.0)
MCV: 89.6 fL (ref 80.0–100.0)
Platelets: 204 10*3/uL (ref 150–400)
RBC: 4.24 MIL/uL (ref 3.87–5.11)
RDW: 12.5 % (ref 11.5–15.5)
WBC: 22.4 10*3/uL — ABNORMAL HIGH (ref 4.0–10.5)
nRBC: 0 % (ref 0.0–0.2)

## 2021-01-23 LAB — RPR: RPR Ser Ql: NONREACTIVE

## 2021-01-23 LAB — TYPE AND SCREEN
ABO/RH(D): O NEG
Antibody Screen: POSITIVE

## 2021-01-23 LAB — SARS CORONAVIRUS 2 BY RT PCR (HOSPITAL ORDER, PERFORMED IN ~~LOC~~ HOSPITAL LAB): SARS Coronavirus 2: NEGATIVE

## 2021-01-23 MED ORDER — TETANUS-DIPHTH-ACELL PERTUSSIS 5-2.5-18.5 LF-MCG/0.5 IM SUSY: 0.5000 mL | PREFILLED_SYRINGE | Freq: Once | INTRAMUSCULAR | Status: DC

## 2021-01-23 MED ORDER — FAMOTIDINE 20 MG PO TABS
20.0000 mg | ORAL_TABLET | Freq: Every day | ORAL | Status: DC
Start: 2021-01-23 — End: 2021-01-25
  Administered 2021-01-23 – 2021-01-25 (×3): 20 mg via ORAL
  Filled 2021-01-23 (×3): qty 1

## 2021-01-23 MED ORDER — SENNOSIDES-DOCUSATE SODIUM 8.6-50 MG PO TABS
2.0000 | ORAL_TABLET | ORAL | Status: DC
  Administered 2021-01-23 – 2021-01-25 (×3): 2 via ORAL
  Filled 2021-01-23 (×3): qty 2

## 2021-01-23 MED ORDER — WITCH HAZEL-GLYCERIN EX PADS: 1.0000 "application " | MEDICATED_PAD | CUTANEOUS | Status: DC | PRN

## 2021-01-23 MED ORDER — DIBUCAINE (PERIANAL) 1 % EX OINT: 1.0000 "application " | TOPICAL_OINTMENT | CUTANEOUS | Status: DC | PRN

## 2021-01-23 MED ORDER — ONDANSETRON HCL 4 MG PO TABS: 4.0000 mg | ORAL_TABLET | ORAL | Status: DC | PRN

## 2021-01-23 MED ORDER — BENZOCAINE-MENTHOL 20-0.5 % EX AERO
1.0000 "application " | INHALATION_SPRAY | Freq: Four times a day (QID) | CUTANEOUS | Status: DC
  Administered 2021-01-23 – 2021-01-24 (×5): 1 via TOPICAL
  Filled 2021-01-23 (×2): qty 56

## 2021-01-23 MED ORDER — SIMETHICONE 80 MG PO CHEW: 80.0000 mg | CHEWABLE_TABLET | ORAL | Status: DC | PRN

## 2021-01-23 MED ORDER — IBUPROFEN 600 MG PO TABS
600.0000 mg | ORAL_TABLET | Freq: Four times a day (QID) | ORAL | Status: DC
  Administered 2021-01-23 – 2021-01-25 (×10): 600 mg via ORAL
  Filled 2021-01-23 (×10): qty 1

## 2021-01-23 MED ORDER — PRENATAL MULTIVITAMIN CH
1.0000 | ORAL_TABLET | Freq: Every day | ORAL | Status: DC
  Administered 2021-01-23 – 2021-01-25 (×3): 1 via ORAL
  Filled 2021-01-23 (×3): qty 1

## 2021-01-23 MED ORDER — ONDANSETRON HCL 4 MG/2ML IJ SOLN: 4.0000 mg | INTRAMUSCULAR | Status: DC | PRN

## 2021-01-23 MED ORDER — DIPHENHYDRAMINE HCL 25 MG PO CAPS
25.0000 mg | ORAL_CAPSULE | Freq: Four times a day (QID) | ORAL | Status: DC | PRN
Start: 2021-01-23 — End: 2021-01-25

## 2021-01-23 MED ORDER — ACETAMINOPHEN 325 MG PO TABS: 650.0000 mg | ORAL_TABLET | ORAL | Status: DC | PRN

## 2021-01-23 MED ORDER — FAMOTIDINE 20 MG PO TABS: 20.0000 mg | ORAL_TABLET | Freq: Every day | ORAL | Status: DC

## 2021-01-23 MED ORDER — COCONUT OIL OIL: 1.0000 "application " | TOPICAL_OIL | Status: DC | PRN

## 2021-01-23 NOTE — Social Work (Signed)
CSW received consult for hx of Anxiety and Postpartum Depression. CSW met with MOB to offer support and complete assessment.     CSW introduced self and role. CSW observed FOB on couch and grandmother also present, holding newborn Declan. CSW offered to return to speak with MOB in private, MOB declined and stated the guests could remain in the room. CSW informed MOB of reason for consult. MOB expressed understanding. 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 MOB stated it was mostly anxiety. MOB disclosed she was already taking Wellbutrin for a teen diagnosis of mild depression, and was prescribed Zoloft to treat the PPD/ PP anxiety. MOB stated both medications were very helpful. MOB was also in therapy which was helpful. MOB reported she is currently feeling pretty good and had a good pregnancy. MOB identified immediate family and neighbors as supports. MOB denies any current SI, HI or domestic violence. MOB reported she feels comfortable utilizing resources or reaching out to her OBGYN if postpartum mental health challenges arise.   CSW provided education regarding the baby blues period vs. perinatal mood disorders, discussed treatment and provided resources for mental health follow up if concerns arise.  CSW recommended self-evaluation during the postpartum time period using the New Mom Checklist from Postpartum Progress and encouraged MOB to contact a medical professional if symptoms are noted at any time.   CSW provided review of Sudden Infant Death Syndrome (SIDS) precautions.  MOB reported she has all essential needs for baby, including a crib. Baby will receive follow-up care at [facility], MOB denies any transportation barriers. MOB declined having any additional needs at this time.   CSW identifies no further need for intervention and no barriers to discharge at this time.  Glynnis Gavel, LCSWA Clinical Social Work [facility] [phone] 

## 2021-01-23 NOTE — Lactation Note (Signed)
This note was copied from a baby's chart. Lactation Consultation Note  Patient Name: Erica Estes [id number] Date: 01/23/2021   Age:38 hours  [name] STS on arrival.  Mom reports he is sleepy.  Mom has good breastfeeding hx.  Reports did get some worsening depression with her second baby.  Discussed trying to sleep when the baby sleeps.  Mom feels when he breastfeeds he feeds well.  Adequate voids and stools and minimal weight loss.  Tech came in to do his hearing assessment.  Urged mom to feed on cue and 8-12 or more times day.  Praised breastfeeding efforts.  Urged to call lactation as needed.  Maternal Data    Feeding Feeding Type: Breast Fed  [facility] Score                   Interventions    Lactation Tools Discussed/Used     Consult Status      Jadyn Barge Michaelle Copas 01/23/2021, 6:01 PM

## 2021-01-23 NOTE — Lactation Note (Signed)
This note was copied from a baby's chart. Lactation Consultation Note  Patient Name: Erica Estes [id number] Date: 01/23/2021 Reason for consult: L&D Initial assessment;Term Age:38 hours P3, term female infant. LC congratulated parents on the birth of their son. Per mom, she has DEBP at home. Mom is experienced at breastfeeding, she BF 1st child for one year and 2nd child for 15 months. Per mom, infant has been spitty and she had a  fast labor.  Per mom, infant reminds her of her first son who had a lot of anomic fluid and didn't latch well until after 24 hours.  Mom latched infant on her left breast using the football hold and then switch to cradle hold, infant was on and off breast , not sustaining latch  , BF  for 10 minutes. Mom hand express and infant took 4 mls of colostrum by spoon. Mom will continue to breastfeed infant by primal cues, rooting, licking, tasting, hands in mouth, etc. Mom was doing STS with infant when LC left the room. Mom knows RN and LC will assist mom with latch on MBU if needed. LC briefly discussed LC services within the local community.  Maternal Data Formula Feeding for Exclusion: No Has patient been taught Hand Expression?: Yes Does the patient have breastfeeding experience prior to this delivery?: Yes  Feeding Feeding Type: Breast Fed  LATCH Score Latch: Repeated attempts needed to sustain latch, nipple held in mouth throughout feeding, stimulation needed to elicit sucking reflex.  Audible Swallowing: A few with stimulation  Type of Nipple: Everted at rest and after stimulation  Comfort (Breast/Nipple): Soft / non-tender  Hold (Positioning): Assistance needed to correctly position infant at breast and maintain latch.  LATCH Score: 7  Interventions Interventions: Breast feeding basics reviewed;Assisted with latch;Skin to skin;Breast massage;Hand express;Position options;Support pillows;Adjust position;Breast compression  Lactation Tools  Discussed/Used WIC Program: No   Consult Status Consult Status: Follow-up Date: 01/23/21 Follow-up type: In-patient    Danelle Earthly 01/23/2021, 3:04 AM

## 2021-01-24 NOTE — Discharge Summary (Signed)
SVD OB Discharge Summary     Patient Name: Erica Estes DOB: 1981/02/21 MRN: [id number]  Date of admission: 01/22/2021 Delivering MD: Rhea Pink B  Date of delivery: 01/23/2021 Type of delivery: SVD  Newborn Data: Sex: Baby female Circumcision: in pt desired prior to discharge today Live born female  Birth Weight: 7 lb 13.4 oz (3555 g) APGAR: 9, 9  Newborn Delivery   Birth date/time: 01/23/2021 01:45:00 Delivery type: Vaginal, Spontaneous      Feeding: breast Infant being discharge to home with mother in stable condition.   Admitting diagnosis: Normal labor [O80, Z37.9] Intrauterine pregnancy: [redacted]w[redacted]d     Secondary diagnosis:  Active Problems:   Normal labor   SVD (1/25)   Normal postpartum course                                Complications: None                                                              Intrapartum Procedures: spontaneous vaginal delivery Postpartum Procedures: none Complications-Operative and Postpartum: 1st degree perineal laceration Augmentation: N/A   History of Present Illness: Erica Estes is a 40 y.o. female, [id number], who presents at [redacted]w[redacted]d weeks gestation. The patient has been followed at  [facility]  Her pregnancy has been complicated by:  Patient Active Problem List   Diagnosis Date Noted  . Normal postpartum course 01/24/2021  . SVD (1/25) 01/23/2021  . Normal labor 01/22/2021  . Maternal age 22+, multigravida, antepartum 07/28/2020  . History of ectopic pregnancy 07/04/2020  . History of migraine 07/04/2020  . Depressive disorder 07/04/2020  . Anxiety 07/04/2020  . Irritable bowel syndrome 08/31/2016  . Rh negative status during pregnancy 04/05/2015  . History of depression 04/05/2015    Hospital course:  Onset of Labor With Vaginal Delivery      40 y.o. yo [id number] at [redacted]w[redacted]d was admitted in Latent Labor on 01/22/2021. Patient had an uncomplicated labor course as follows:  Membrane Rupture  Time/Date: 1:42 AM ,01/23/2021   Delivery Method:Vaginal, Spontaneous  Episiotomy: None  Lacerations:  1st degree  Patient had an uncomplicated postpartum course.  She is ambulating, tolerating a regular diet, passing flatus, and urinating well. Patient is discharged home in stable condition on 01/24/21.  Newborn Data: Birth date:01/23/2021  Birth time:1:45 AM  Gender:Female  Living status:Living  Apgars:9 ,9  Weight:3555 g  Postpartum Day # 2 : S/P NSVD due to spontaneous normal labor, progressed natural over 1st degree laceration repaired, ebl with hgb drop of 13-12.8. Patient up ad lib, denies syncope or dizziness. Reports consuming regular diet without issues and denies N/V. Patient reports 0 bowel movement + passing flatus.  Denies issues with urination and reports bleeding is "lighter."  Patient is breastfeeding and reports going well.  Desires copper IUD for postpartum contraception.  Pain is being appropriately managed with use of po meds. Pt meets criteria for early discharge and desires to go home.  Pt has H/O depression and anxiety, was on zoloft and Wellbutrin which worked well for her, denies HI/SI, mood stable and declines to restart medication at this time. Will have a 2 week mood  check up with [facility]. Pt RH-, but NB RH-*, therefore Rhogam not indicated at this time.   Physical exam  Vitals:   01/23/21 1317 01/23/21 1742 01/23/21 1924 01/24/21 0552  BP: 118/70 117/78 122/73 109/79  Pulse: (!) 59 60 68 65  Resp: 18 18 18 17   Temp: 98.1 F (36.7 C) 98.2 F (36.8 C) 98.6 F (37 C) 98 F (36.7 C)  TempSrc: Oral  Oral Oral  SpO2: 98% 99% 100% 100%  Weight:      Height:       General: alert, cooperative and no distress Lochia: appropriate Uterine Fundus: firm Perineum: approximate DVT Evaluation: No evidence of DVT seen on physical exam. Negative Homan's sign. No cords or calf tenderness. No significant calf/ankle edema.  Labs: Lab Results  Component Value Date    WBC 22.4 (H) 01/23/2021   HGB 12.8 01/23/2021   HCT 38.0 01/23/2021   MCV 89.6 01/23/2021   PLT 204 01/23/2021   CMP Latest Ref Rng & Units 10/20/2019  Glucose 70 - 99 mg/dL 86  BUN 6 - 20 mg/dL 13  Creatinine 10/22/2019 - 4.09 mg/dL 7.35  Sodium 3.29 - 924 mmol/L 136  Potassium 3.5 - 5.1 mmol/L 3.2(L)  Chloride 98 - 111 mmol/L 103  CO2 22 - 32 mmol/L 21(L)  Calcium 8.9 - 10.3 mg/dL 268  Total Protein 6.0 - 8.3 g/dL -  Total Bilirubin 0.3 - 1.2 mg/dL -  Alkaline Phos 39 - 34.1 U/L -  AST 0 - 37 U/L -  ALT 0 - 35 U/L -    Date of discharge: 01/24/2021 Discharge Diagnoses: Term Pregnancy-delivered Discharge instruction: per After Visit Summary and "Baby and Me Booklet".  After visit meds:   Activity:           unrestricted and pelvic rest Advance as tolerated. Pelvic rest for 6 weeks.  Diet:                routine Medications: PNV and Ibuprofen Postpartum contraception: IUD Copper Condition:  Pt discharge to home with baby in stable and condition  Meds:  Discharge Follow Up:   Follow-up Information    [facility]. Schedule an appointment as soon as possible for a visit in 2 week(s).   Specialty: Obstetrics and Gynecology Why: 2 week mood check and 6 week PPV Contact information: [address] [place] [phone]               [place], NP-C, CNM 01/24/2021, 2:13 PM  [facility], FNP  Addendum 2:13 PM 01/24/21  RN notified me pt will not go home today. Pt will be discharged tomorrow.   [facility] CNM, FNP-C

## 2021-01-24 NOTE — Lactation Note (Signed)
This note was copied from a baby's chart. Lactation Consultation Note  Patient Name: Erica Estes [id number] Date: 01/24/2021 Reason for consult: Follow-up assessment Age:38 hours  P3 mother whose infant is now 57 hours old.  This is a term baby at 39+5 weeks.  Mother breast fed her first child (now 40 years old) for one year and her second child (now 43 years old) for 15 months.  Baby was swaddled and asleep in father's arms when I arrived.  Mother had no questions/concerns related to breast feeding.  She seems very confident with her breast feeding skills and knowledge.  She is familiar with engorgement and did not wish to review.  Manual pump with instructions provided.  #24 flange size is appropriate at this time.  Mother has our OP phone number for any questions after discharge.  Family is looking forward to going home today.   Maternal Data    Feeding    LATCH Score                   Interventions    Lactation Tools Discussed/Used     Consult Status Consult Status: Complete Date: 01/24/21 Follow-up type: Call as needed    Irene Pap Cassanda Walmer 01/24/2021, 8:13 AM

## 2021-01-24 NOTE — Lactation Note (Signed)
This note was copied from a baby's chart. Lactation Consultation Note  Patient Name: Erica Estes [id number] Date: 01/24/2021 Reason for consult: Follow-up assessment Age:38 hours   P3 mother whose infant is now 34 hours old.  This is a term baby at 39+5 weeks.  Mother breast fed her first child (now 29 years old) for one year and her second child (now 83 years old) for 15 months.  Family was not able to be discharged today due to increased jaundice.  Baby's bilirubin level this morning was 10.5 mg/dl at 32 hours of life.  Baby was wrapped in a biliblanket when I arrived; eye goggles in place.  Mother was holding him and he began to get fussy.  Offered to assist with latching, however, mother did not desire any assistance.  She placed her NS on and latched baby but he was not interested and did not sustain a latch or feed at all.    Discussed and educated parents about providing adequate supplementation volumes during this time.  Reassured mother that it is more important to address the increased bilirubin rather than becoming too focused on breast feeding well at this time.  Of course, it is advisable to continue to latch to the breast prior to any supplementation, however, if he is not interested or does not sustain a latch, it will be important to provide adequate supplementation.  At the last pumping session mother was able to obtain 12 mls of EBM.  She had this volume in the refrigerator.  I removed it from the refrigerator, provided a cup of hot water and asked father to feed this to baby.  Father willing to do this.  Asked parents to immediately feed back any EBM mother pumps rather than refrigerate it.  Parents verbalized understanding.  Father is using a purple extra slow flow nipple without difficulty.  Reminded mother to continue to feed at least every three hours or sooner if baby shows cues.  She should provide supplementation immediately after breast feeding and pump for 15 minutes after  every feeding. Explained that we have donor breast milk and/or formula for extra supplementation as needed.  Mother stated baby has been sleepy; informed her that an incrrease in bilirubin levels and light therapy can make babies sleepier and less willing to feed.  Suggested she call for assistance as needed.  Mother is confident in her breast feeding ability and does not seem to want any help.  Reminded her that I don't want her to become frustrated and it is important to provide adequate intake now.  Mother verbalized understanding.  Father present and supportive.  RN updated.    Maternal Data    Feeding Feeding Type: Breast Fed Nipple Type: Extra Slow Flow  LATCH Score                   Interventions    Lactation Tools Discussed/Used Tools: Pump (DEBP setup, initiated ,and cleaning reviewed) Breast pump type: Double-Electric Breast Pump   Consult Status Consult Status: Follow-up Date: 01/25/21 Follow-up type: In-patient    [name] [name] 01/24/2021, 4:10 PM

## 2021-01-25 MED ORDER — ACETAMINOPHEN 325 MG PO TABS: 650.0000 mg | ORAL_TABLET | ORAL | Status: AC | PRN

## 2021-01-25 MED ORDER — IBUPROFEN 600 MG PO TABS: 600.0000 mg | ORAL_TABLET | Freq: Four times a day (QID) | ORAL | 0 refills | Status: AC

## 2021-01-25 NOTE — Discharge Summary (Signed)
OB Discharge Summary  Patient Name: Erica Estes DOB: 08/21/1981 MRN: [id number]  Date of admission: 01/22/2021 Intrauterine pregnancy: [redacted]w[redacted]d   Admitting diagnosis: Normal labor [O80, Z37.9]  Date of discharge: 01/25/2021    Discharge diagnosis: Term vaginal delivery  Prenatal history: [id number]   EDC : 01/25/2021, by Last Menstrual Period  Prenatal care at [facility]  Primary provider : [facility] Prenatal course complicated by N/A Hx of PPD  Prenatal Labs: ABO, Rh: --/--/O NEG (01/24 2335) / Rhophylac not given. NB RH- Antibody: POS (01/24 2335) Rubella:   immune RPR: NON REACTIVE (01/24 2335)  HBsAg:   NR HIV:   NR GBS: Negative/-- (01/04 0000)                                    Hospital course:  Onset of Labor With Vaginal Delivery      40 y.o. yo [id number] at [redacted]w[redacted]d was admitted in Active Labor on 01/22/2021. Patient had an uncomplicated labor course as follows:  Membrane Rupture Time/Date: 1:42 AM ,01/23/2021   Delivery Method:Vaginal, Spontaneous  Episiotomy: None  Lacerations:  1st degree  Patient had an uncomplicated postpartum course.  She is ambulating, tolerating a regular diet, passing flatus, and urinating well. Patient is discharged home in stable condition on 01/25/21.  Newborn Data: Birth date:01/23/2021  Birth time:1:45 AM  Gender:Female  Living status:Living  Apgars:9 ,9  Weight:3555 g  Delivering PROVIDER: Rhea Pink B                                                            Complications: None  Newborn Data: Live born female  Birth Weight: 7 lb 13.4 oz (3555 g) APGAR: 9, 9 Desires in patient circumcision prior to discharge today  Newborn Delivery   Birth date/time: 01/23/2021 01:45:00 Delivery type: Vaginal, Spontaneous      Baby Feeding: Breast Disposition:home with mother  Post partum procedures: N/A  Labs: Lab Results  Component Value Date   WBC 22.4 (H) 01/23/2021   HGB 12.8 01/23/2021   HCT 38.0 01/23/2021   MCV 89.6 01/23/2021   PLT  204 01/23/2021   CMP Latest Ref Rng & Units 10/20/2019  Glucose 70 - 99 mg/dL 86  BUN 6 - 20 mg/dL 13  Creatinine 7.78 - 2.42 mg/dL 3.53  Sodium 614 - 431 mmol/L 136  Potassium 3.5 - 5.1 mmol/L 3.2(L)  Chloride 98 - 111 mmol/L 103  CO2 22 - 32 mmol/L 21(L)  Calcium 8.9 - 10.3 mg/dL 54.0  Total Protein 6.0 - 8.3 g/dL -  Total Bilirubin 0.3 - 1.2 mg/dL -  Alkaline Phos 39 - 086 U/L -  AST 0 - 37 U/L -  ALT 0 - 35 U/L -    Physical Exam @ time of discharge:  Vitals:   01/24/21 0552 01/24/21 1500 01/24/21 2130 01/25/21 0615  BP: 109/79 110/69 116/72 112/67  Pulse: 65 65 76   Resp: 17 18 18 16   Temp: 98 F (36.7 C) 97.7 F (36.5 C) 98.1 F (36.7 C) 98.3 F (36.8 C)  TempSrc: Oral Oral Oral Oral  SpO2: 100% 100% 99% 100%  Weight:      Height:  general: alert, cooperative and no distress lochia: appropriate uterine fundus: firm Perineum: 1st degree  Extremities: WNL DVT Evaluation: No evidence of DVT seen on physical exam. Negative Homan's sign. No cords or calf tenderness. No significant calf/ankle edema.  Discharge instructions:  "Baby and Me Booklet"  Discharge Medications: Ibuprofen Tylenol PNV  Diet: routine diet Activity: Advance as tolerated. Pelvic rest x 6 weeks.  Follow up:2 weeks for mood check  6 weeks pp visit  Signed: Carollee Leitz MSN, CNM 01/25/2021, 12:54 PM

## 2021-01-25 NOTE — Lactation Note (Signed)
This note was copied from a baby's chart. Lactation Consultation Note  Patient Name: Erica Estes MVHQI'O Date: 01/25/2021 Reason for consult: Hyperbilirubinemia;Term;Follow-up assessment Age:38 hours   Mom attempting to feed infant but infant is very sleeping.  He BF for 15 minutes and was then supplemented (15 mls) with mom's milk via bottle after the breastfeed.    LC encouraged mom to use more pillows so she is not having to lean forward to latch infant.  Mom's breasts are filling and feel warm.  When [facility] assessed breasts, they felt slightly full but not at all hard and were not painful to touch.    LC observed mom trying to latch sleeping infant.  She is using a NS she said she used with her first two children for 1 wk postpartum.   LC reviewed proper application of shield.  Mom states infant tucks his top lip in when feeding.  Infant would open to latch then fall asleep.    He did nurse for approx. 2 minutes occasionally swallowing before falling asleep.    LC suggested massage and compression when infant feeds to keep him active at the breast.  Infant appears relaxed and not hungry during visit.   LC reviewed engorgement prevention, STS, hand pump use if needed,  and encouraged mom to call phone line if she has questions.  She is aware of OP services as well as support groups.      Maternal Data    Feeding Feeding Type: Breast Fed  LATCH Score Latch: Repeated attempts needed to sustain latch, nipple held in mouth throughout feeding, stimulation needed to elicit sucking reflex.  Audible Swallowing: A few with stimulation  Type of Nipple: Everted at rest and after stimulation  Comfort (Breast/Nipple): Filling, red/small blisters or bruises, mild/mod discomfort  Hold (Positioning): No assistance needed to correctly position infant at breast.  LATCH Score: 7  Interventions Interventions: Breast feeding basics reviewed;Skin to skin;Support pillows;Adjust  position  Lactation Tools Discussed/Used Tools: Nipple Shields Nipple shield size: 24   Consult Status Consult Status: Complete Date: 01/25/21 Follow-up type: In-patient    Maryruth Hancock [facility] 01/25/2021, 12:04 PM

## 2021-06-29 ENCOUNTER — Ambulatory Visit (INDEPENDENT_AMBULATORY_CARE_PROVIDER_SITE_OTHER): Payer: Managed Care, Other (non HMO) | Admitting: Obstetrics & Gynecology

## 2021-06-29 ENCOUNTER — Encounter: Payer: Self-pay | Admitting: Obstetrics & Gynecology

## 2021-06-29 VITALS — BP 102/70 | HR 67 | Resp 16 | Ht 66.5 in | Wt 157.0 lb

## 2021-06-29 DIAGNOSIS — Z30431 Encounter for routine checking of intrauterine contraceptive device: Secondary | ICD-10-CM

## 2021-06-29 DIAGNOSIS — Z01419 Encounter for gynecological examination (general) (routine) without abnormal findings: Secondary | ICD-10-CM | POA: Diagnosis not present

## 2021-06-29 NOTE — Progress Notes (Signed)
Erica Estes Sep 13, 1981 [id number]   History:    40 y.o. G5P3A2(1 clinical molar pregnancy 09/2019)L3  Married.  Youngest son is 51 month old.      HPI:  Well on ParaGard IUD x 02/2021.  Breastfeeding her 1 month old son.  No menses or BTB.  No pelvic pain.  No pain with IC. Breasts normal.  BMI 24.96.  Good fitness.    Past medical history,surgical history, family history and social history were all reviewed and documented in the EPIC chart.  Gynecologic History No LMP recorded. (Menstrual status: IUD).  Obstetric History OB History  Gravida Para Term Preterm AB Living  5 3 3  0 2 3  SAB IAB Ectopic Multiple Live Births  0 0 1 0 3    # Outcome Date GA Lbr Len/2nd Weight Sex Delivery Anes PTL Lv  5 Term 01/23/21 [redacted]w[redacted]d 08:05 / 00:10 7 lb 13.4 oz (3.555 kg) M Vag-Spont None  LIV  4 Molar 10/20/19          3 Term 04/04/15 [redacted]w[redacted]d 15:05 / 00:25 7 lb 15.7 oz (3.62 kg) M Vag-Spont EPI  LIV  2 Term 12/29/12 [redacted]w[redacted]d 23:16 / 00:43 6 lb 15 oz (3.147 kg) M Vag-Spont EPI  LIV  1 Ectopic 2011             ROS: A ROS was performed and pertinent positives and negatives are included in the history.  GENERAL: No fevers or chills. HEENT: No change in vision, no earache, sore throat or sinus congestion. NECK: No pain or stiffness. CARDIOVASCULAR: No chest pain or pressure. No palpitations. PULMONARY: No shortness of breath, cough or wheeze. GASTROINTESTINAL: No abdominal pain, nausea, vomiting or diarrhea, melena or bright red blood per rectum. GENITOURINARY: No urinary frequency, urgency, hesitancy or dysuria. MUSCULOSKELETAL: No joint or muscle pain, no back pain, no recent trauma. DERMATOLOGIC: No rash, no itching, no lesions. ENDOCRINE: No polyuria, polydipsia, no heat or cold intolerance. No recent change in weight. HEMATOLOGICAL: No anemia or easy bruising or bleeding. NEUROLOGIC: No headache, seizures, numbness, tingling or weakness. PSYCHIATRIC: No depression, no loss of interest in normal activity  or change in sleep pattern.     Exam:   BP 102/70   Pulse 67   Resp 16   Ht 5' 6.5" (1.689 m)   Wt 157 lb (71.2 kg)   Breastfeeding Yes   BMI 24.96 kg/m   Body mass index is 24.96 kg/m.  General appearance : Well developed well nourished female. No acute distress HEENT: Eyes: no retinal hemorrhage or exudates,  Neck supple, trachea midline, no carotid bruits, no thyroidmegaly Lungs: Clear to auscultation, no rhonchi or wheezes, or rib retractions  Heart: Regular rate and rhythm, no murmurs or gallops Breast:Examined in sitting and supine position were symmetrical in appearance, no palpable masses or tenderness,  no skin retraction, no nipple inversion, no nipple discharge, no skin discoloration, no axillary or supraclavicular lymphadenopathy Abdomen: no palpable masses or tenderness, no rebound or guarding Extremities: no edema or skin discoloration or tenderness  Pelvic: Vulva: Normal             Vagina: No gross lesions or discharge  Cervix: No gross lesions or discharge.  Strings felt at [facility].  Uterus  AV, normal size, shape and consistency, non-tender and mobile  Adnexa  Without masses or tenderness  Anus: Normal   Assessment/Plan:  40 y.o. female for annual exam   1. Well female exam with routine gynecological exam  Normal gynecologic exam.  Last Pap test April 2021 was negative, will repeat at 2 to 3 years.  Breast exam normal.  Health labs with family physician.  Good body mass index at 24.96.  Continue with fitness and healthy nutrition.  2. Encounter for routine checking of intrauterine contraceptive device (IUD) ParaGard IUD inserted at postpartum visit of 8 weeks at the end of March 2022.  IUD well-tolerated and in good position.  Other orders - VITAMIN D PO; 5,000 Int'l Units daily. - paragard intrauterine copper IUD IUD; ParaGard T 380A 380 square mm intrauterine device  Take 1 device by intrauterine route.   Genia Del MD, 11:48 AM 06/29/2021

## 2021-10-27 IMAGING — US US MFM OB DETAIL+14 WK
1 series · 13 of 28 positions shown · non-contrast
Comparison: none

[Series 1: us mfm ob detail+14 wk · 13 of 109 slices shown]
[im 5/109]
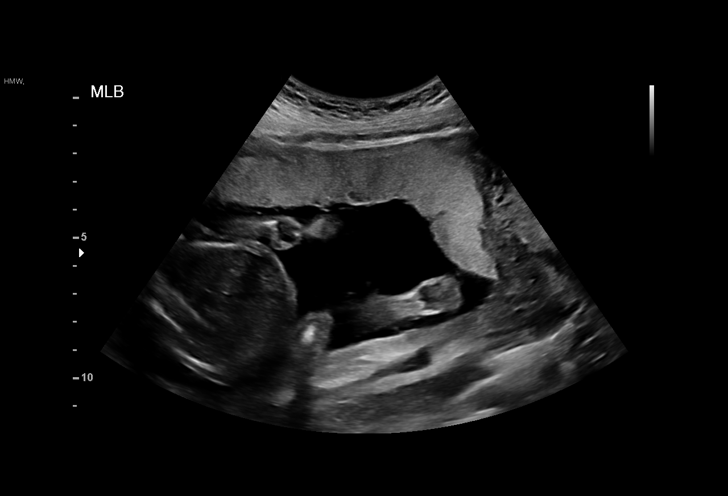
[im 13/109]
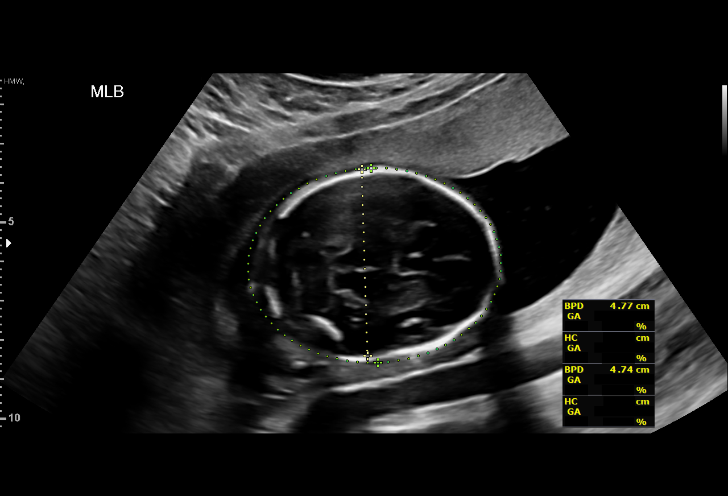
[im 21/109]
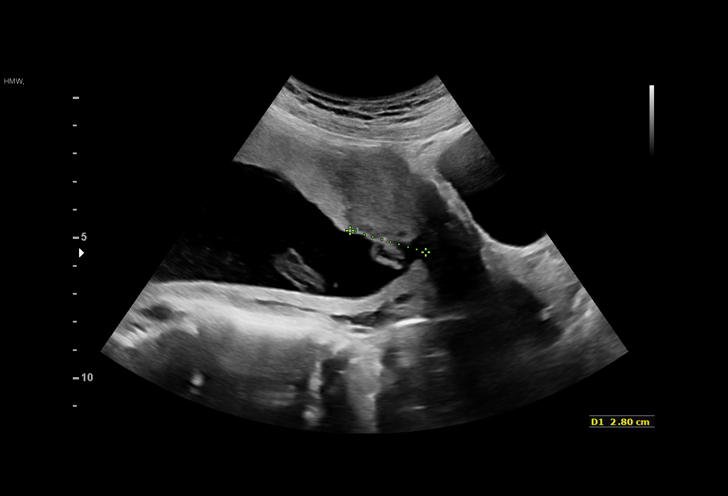
[im 29/109]
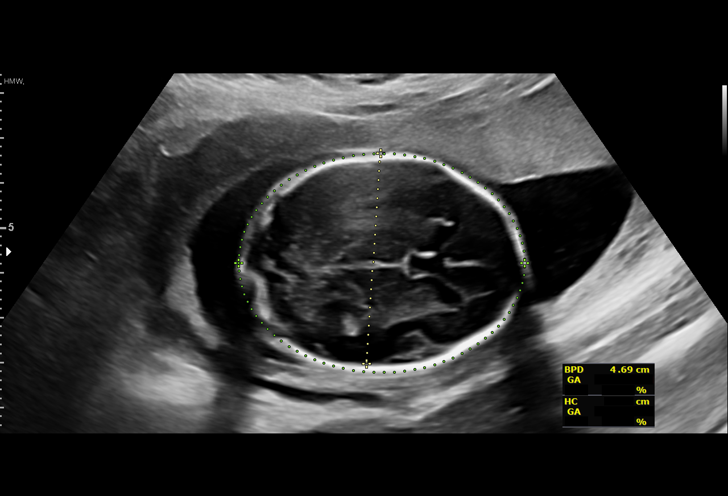
[im 37/109]
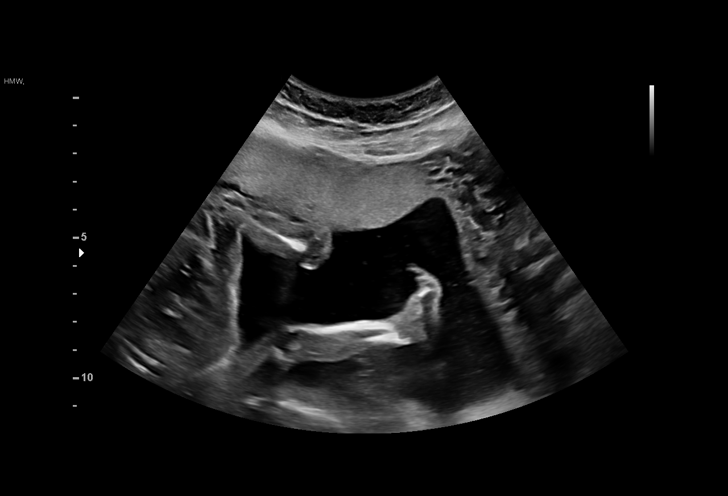
[im 45/109]
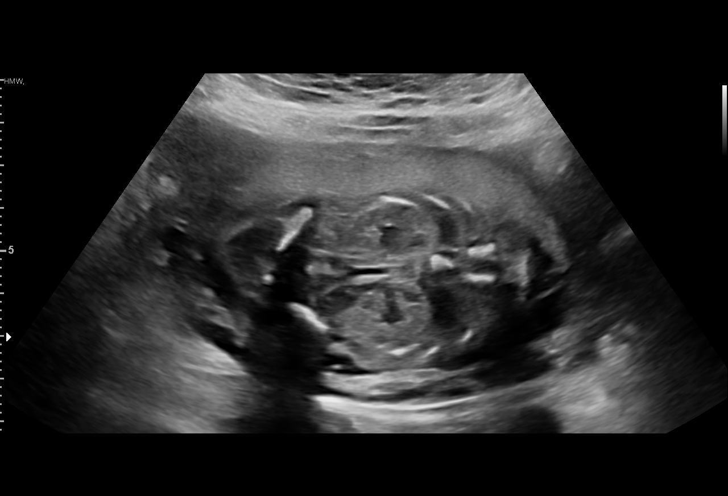
[im 57/109]
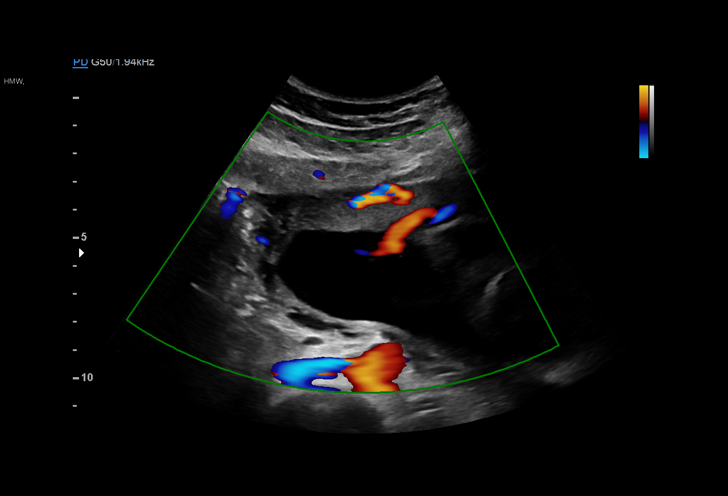
[im 65/109]
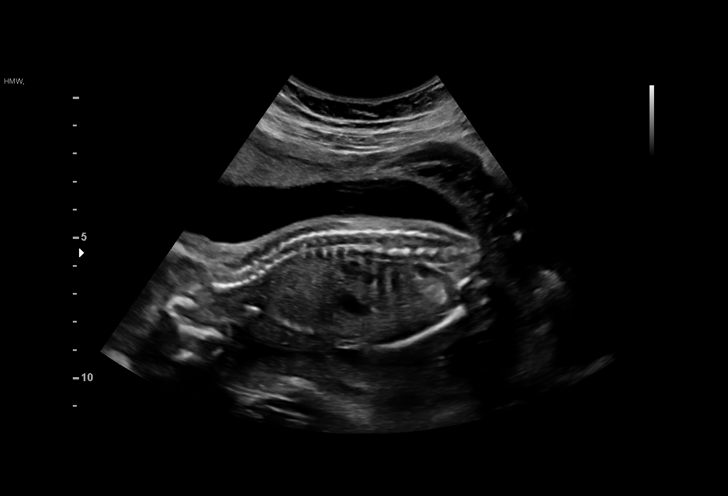
[im 73/109]
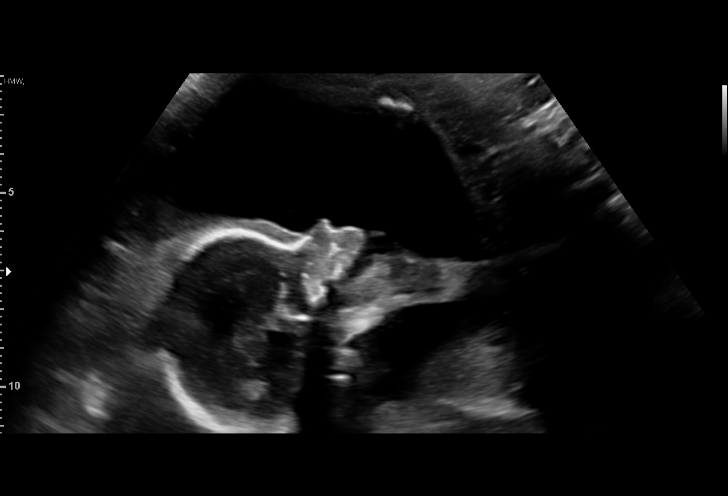
[im 81/109]
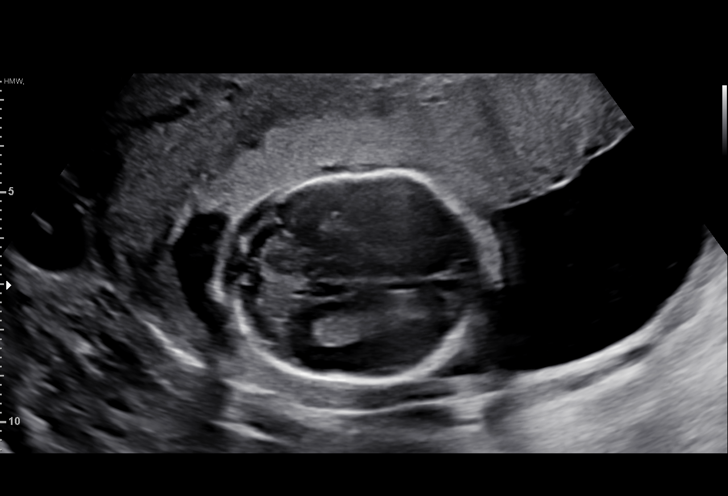
[im 89/109]
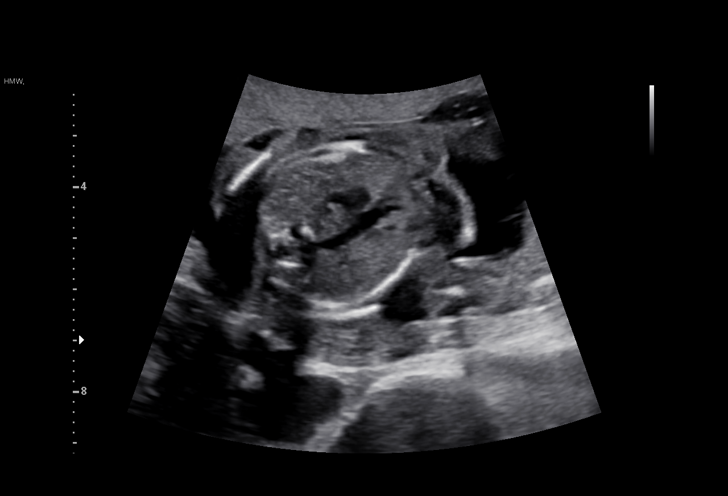
[im 97/109]
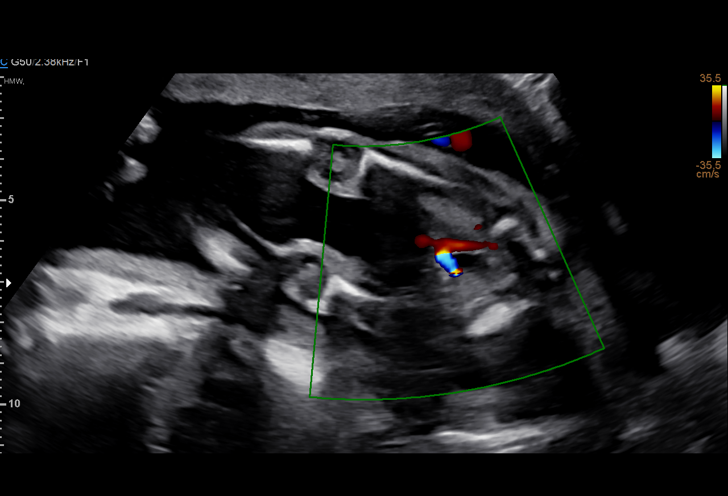
[im 105/109]
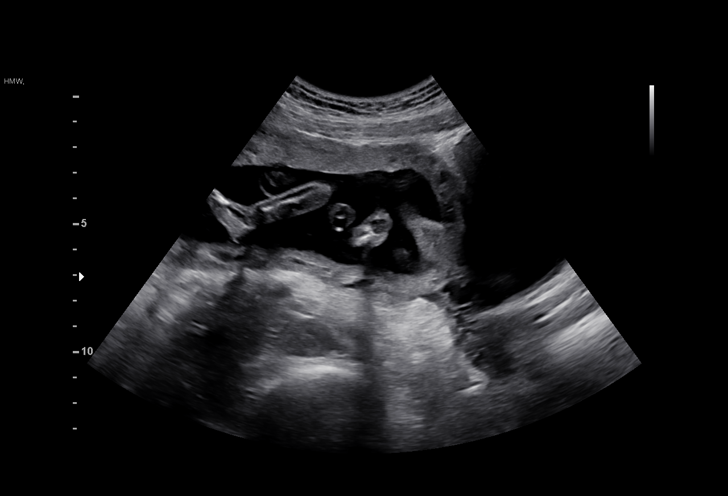

[13 of 28 positions shown; findings below may reference images not displayed]

#130

 1  US MFM OB DETAIL +14 WK               76811.01    DOCTORAY EDIA

Indications

 Advanced maternal age multigravida 35+,
 second trimester
 20 weeks gestation of pregnancy
 Encounter for antenatal screening for
 malformations
 Low risk NIPS, 7.4 FF, declined [facility]
Fetal Evaluation

 Num Of Fetuses:         1
 Fetal Heart Rate(bpm):  125
 Cardiac Activity:       Observed
 Presentation:           Transverse, head to maternal left
 Placenta:               Anterior
 P. Cord Insertion:      Visualized, central

 Amniotic Fluid
 AFI FV:      Within normal limits

                             Largest Pocket(cm)

Biometry

 BPD:      46.9  mm     G. Age:  20w 1d         51  %    CI:        69.75   %    70 - 86
                                                         FL/HC:      18.1   %    16.8 -
 HC:      179.2  mm     G. Age:  20w 3d         52  %    HC/AC:      1.08        1.09 -
 AC:      166.1  mm     G. Age:  21w 4d         87  %    FL/BPD:     69.3   %
 FL:       32.5  mm     G. Age:  20w 1d         42  %    FL/AC:      19.6   %    20 - 24
 HUM:      31.4  mm     G. Age:  20w 3d         61  %
 CER:      20.3  mm     G. Age:  19w 4d         50  %
 NFT:       5.2  mm

 LV:        6.7  mm
 CM:        7.4  mm

 Est. FW:     383  gm    0 lb 14 oz      84  %
OB History

 Gravidity:    5         Term:   2        Prem:   0        SAB:   1
 TOP:          0       Ectopic:  1        Living: 2
Gestational Age

 LMP:           20w 1d        Date:  04/20/20                 EDD:   01/25/21
 U/S Today:     20w 4d                                        EDD:   01/22/21
 Best:          20w 1d     Det. By:  LMP  (04/20/20)          EDD:   01/25/21
Anatomy

 Cranium:               Appears normal         Aortic Arch:            Not well visualized
 Cavum:                 Appears normal         Ductal Arch:            Not well visualized
 Ventricles:            Appears normal         Diaphragm:              Appears normal
 Choroid Plexus:        Appears normal         Stomach:                Appears normal, left
                                                                       sided
 Cerebellum:            Appears normal         Abdomen:                Appears normal
 Posterior Fossa:       Appears normal         Abdominal Wall:         Appears nml (cord
                                                                       insert, abd wall)
 Nuchal Fold:           Appears normal         Cord Vessels:           Appears normal (3
                                                                       vessel cord)
 Face:                  Appears normal         Kidneys:                Appear normal
                        (orbits and profile)
 Lips:                  Appears normal         Bladder:                Appears normal
 Thoracic:              Appears normal         Spine:                  Appears normal
 Heart:                 Not well visualized    Upper Extremities:      Appears normal
 RVOT:                  Not well visualized    Lower Extremities:      Appears normal
 LVOT:                  Not well visualized

 Other:  Fetus appears to be a male. Heels visualized. Rt 5th digit visualized.
         Technically difficult due to fetal position.
Cervix Uterus Adnexa

 Cervix
 Length:            3.8  cm.
 Normal appearance by transabdominal scan.

 Uterus
 No abnormality visualized.

 Right Ovary
 No adnexal mass visualized.

 Left Ovary
 No adnexal mass visualized.
 Cul De Sac
 No free fluid seen.

 Adnexa
 No abnormality visualized.
Comments

 This patient was seen for a detailed fetal anatomy scan due
 to advanced maternal age.
 She denies any significant past medical history and denies
 any problems in her current pregnancy.
 She had a cell free DNA test earlier in her pregnancy which
 indicated a low risk for trisomy 21, 18, and 13. A male fetus is
 predicted.
 She was informed that the fetal growth and amniotic fluid
 level were appropriate for her gestational age.
 There were no obvious fetal anomalies noted on today's
 ultrasound exam.  However, the views of the fetal anatomy
 were limited today due to the fetal position.
 The patient was informed that anomalies may be missed due
 to technical limitations. If the fetus is in a suboptimal position
 or maternal habitus is increased, visualization of the fetus in
 the maternal uterus may be impaired.
 The increased risk of fetal aneuploidy due to advanced
 maternal age was discussed. Due to advanced maternal age,
 the patient was offered and declined an amniocentesis today
 for definitive diagnosis of fetal aneuploidy.  She reports that
 she is comfortable with the negative cell free DNA test results.
 A follow-up exam was scheduled in 4 weeks to complete the
 views of the fetal anatomy.

## 2022-10-28 ENCOUNTER — Other Ambulatory Visit (HOSPITAL_COMMUNITY)
Admission: RE | Admit: 2022-10-28 | Discharge: 2022-10-28 | Disposition: A | Discharge status: Home / Self Care | Payer: Managed Care, Other (non HMO) | Source: Ambulatory Visit | Attending: Obstetrics & Gynecology | Admitting: Obstetrics & Gynecology

## 2022-10-28 ENCOUNTER — Encounter: Payer: Self-pay | Admitting: Obstetrics & Gynecology

## 2022-10-28 ENCOUNTER — Ambulatory Visit (INDEPENDENT_AMBULATORY_CARE_PROVIDER_SITE_OTHER): Payer: Managed Care, Other (non HMO) | Admitting: Obstetrics & Gynecology

## 2022-10-28 VITALS — BP 110/72 | HR 66 | Ht 66.54 in | Wt 157.2 lb

## 2022-10-28 DIAGNOSIS — Z01419 Encounter for gynecological examination (general) (routine) without abnormal findings: Secondary | ICD-10-CM | POA: Diagnosis present

## 2022-10-28 DIAGNOSIS — Z30431 Encounter for routine checking of intrauterine contraceptive device: Secondary | ICD-10-CM

## 2022-10-28 NOTE — Progress Notes (Signed)
Erica Estes [birth year] [id number]   History:    41 y.o. G5P3A2 (1 clinical molar pregnancy 09/2019)L3  Married.  Has a 9+ yo, 39 yo and youngest son is 68 month old.      HPI:  Well on ParaGard IUD x 02/2021.  Menses regular normal every month, light premenstrual spotting x 2-3 days.  Breastfeeding for comfort her 40 month old son.  No pelvic pain.  No pain with IC. Pap Neg 03/2020.  Pap reflex today.  Breasts normal.  Will do her first mammo at age 48 next year. BMI 24.97.  Good fitness.     Past medical history,surgical history, family history and social history were all reviewed and documented in the EPIC chart.  Gynecologic History Patient's last menstrual period was 10/20/2022.  Obstetric History OB History  Gravida Para Term Preterm AB Living  5 3 3  0 2 3  SAB IAB Ectopic Multiple Live Births  0 0 1 0 3    # Outcome Date GA Lbr Len/2nd Weight Sex Delivery Anes PTL Lv  5 Term 01/23/21 [redacted]w[redacted]d 08:05 / 00:10 7 lb 13.4 oz (3.555 kg) M Vag-Spont None  LIV  4 Molar 10/20/19          3 Term 04/04/15 [redacted]w[redacted]d 15:05 / 00:25 7 lb 15.7 oz (3.62 kg) M Vag-Spont EPI  LIV  2 Term 12/29/12 [redacted]w[redacted]d 23:16 / 00:43 6 lb 15 oz (3.147 kg) M Vag-Spont EPI  LIV  1 Ectopic 2011             ROS: A ROS was performed and pertinent positives and negatives are included in the history.  GENERAL: No fevers or chills. HEENT: No change in vision, no earache, sore throat or sinus congestion. NECK: No pain or stiffness. CARDIOVASCULAR: No chest pain or pressure. No palpitations. PULMONARY: No shortness of breath, cough or wheeze. GASTROINTESTINAL: No abdominal pain, nausea, vomiting or diarrhea, melena or bright red blood per rectum. GENITOURINARY: No urinary frequency, urgency, hesitancy or dysuria. MUSCULOSKELETAL: No joint or muscle pain, no back pain, no recent trauma. DERMATOLOGIC: No rash, no itching, no lesions. ENDOCRINE: No polyuria, polydipsia, no heat or cold intolerance. No recent change in weight.  HEMATOLOGICAL: No anemia or easy bruising or bleeding. NEUROLOGIC: No headache, seizures, numbness, tingling or weakness. PSYCHIATRIC: No depression, no loss of interest in normal activity or change in sleep pattern.     Exam:   BP 110/72   Pulse 66   Ht 5' 6.54" (1.69 m)   Wt 157 lb 3.2 oz (71.3 kg)   LMP 10/20/2022   SpO2 100%   BMI 24.97 kg/m   Body mass index is 24.97 kg/m.  General appearance : Well developed well nourished female. No acute distress HEENT: Eyes: no retinal hemorrhage or exudates,  Neck supple, trachea midline, no carotid bruits, no thyroidmegaly Lungs: Clear to auscultation, no rhonchi or wheezes, or rib retractions  Heart: Regular rate and rhythm, no murmurs or gallops Breast:Examined in sitting and supine position were symmetrical in appearance, no palpable masses or tenderness,  no skin retraction, no nipple inversion, no nipple discharge, no skin discoloration, no axillary or supraclavicular lymphadenopathy Abdomen: no palpable masses or tenderness, no rebound or guarding Extremities: no edema or skin discoloration or tenderness  Pelvic: Vulva: Normal             Vagina: No gross lesions or discharge  Cervix: No gross lesions or discharge.  IUD strings visible at [facility].  Pap reflex  done.  Uterus  AV, normal size, shape and consistency, non-tender and mobile  Adnexa  Without masses or tenderness  Anus: Normal   Assessment/Plan:  41 y.o. female for annual exam   1. Encounter for routine gynecological examination with Papanicolaou smear of cervix Well on ParaGard IUD x 02/2021.  Menses regular normal every month, light premenstrual spotting x 2-3 days.  Breastfeeding for comfort her 49 month old son.  No pelvic pain.  No pain with IC. Pap Neg 03/2020.  Pap reflex today.  Breasts normal.  Will do her first mammo at age 50 next year. BMI 24.97.  Good fitness.   - Cytology - PAP( [place])  2. Encounter for routine checking of intrauterine contraceptive  device (IUD) Well on ParaGard IUD x 02/2021.  Menses regular normal every month, light premenstrual spotting x 2-3 days.  IUD in good position.  Other orders - Multiple Vitamins-Minerals (MULTIVITAMIN ADULT EXTRA C PO); Take by mouth. - magnesium 30 MG tablet; Take 30 mg by mouth 2 (two) times daily.   Princess Bruins MD, 1:51 PM 10/28/2022

## 2022-10-31 LAB — CYTOLOGY - PAP: Diagnosis: NEGATIVE

## 2023-05-11 ENCOUNTER — Encounter: Payer: Self-pay | Admitting: Obstetrics & Gynecology

## 2023-05-16 ENCOUNTER — Encounter: Payer: Self-pay | Admitting: Obstetrics & Gynecology

## 2023-05-16 ENCOUNTER — Ambulatory Visit (INDEPENDENT_AMBULATORY_CARE_PROVIDER_SITE_OTHER): Payer: Managed Care, Other (non HMO) | Admitting: Obstetrics & Gynecology

## 2023-05-16 VITALS — BP 120/78 | HR 74 | Resp 16

## 2023-05-16 DIAGNOSIS — Z30431 Encounter for routine checking of intrauterine contraceptive device: Secondary | ICD-10-CM

## 2023-05-16 DIAGNOSIS — N946 Dysmenorrhea, unspecified: Secondary | ICD-10-CM

## 2023-05-16 DIAGNOSIS — N920 Excessive and frequent menstruation with regular cycle: Secondary | ICD-10-CM | POA: Diagnosis not present

## 2023-05-16 NOTE — Progress Notes (Signed)
    Erica Estes 23-Jan-1981 [id number]        42 y.o.  [id number] Married  RP: On ParaGard IUD with heavier menstrual bleeding, lasting longer than normal & unable to feel both iud strings, heavy cramping, extreme bloating  HPI: Was well on ParaGard IUD x 02/2021, but recently having heavier menstrual bleeding, lasting longer than normal & unable to feel both iud strings, heavy cramping, extreme bloating.  LMP 05/13/23.  Currently normal flow.  Declines STI screen.    OB History  Gravida Para Term Preterm AB Living  5 3 3  0 2 3  SAB IAB Ectopic Multiple Live Births  0 0 1 0 3    # Outcome Date GA Lbr Len/2nd Weight Sex Delivery Anes PTL Lv  5 Term 01/23/21 [redacted]w[redacted]d 08:05 / 00:10 7 lb 13.4 oz (3.555 kg) M Vag-Spont None  LIV  4 Molar 10/20/19          3 Term 04/04/15 [redacted]w[redacted]d 15:05 / 00:25 7 lb 15.7 oz (3.62 kg) M Vag-Spont EPI  LIV  2 Term 12/29/12 [redacted]w[redacted]d 23:16 / 00:43 6 lb 15 oz (3.147 kg) M Vag-Spont EPI  LIV  1 Ectopic 2011            Past medical history,surgical history, problem list, medications, allergies, family history and social history were all reviewed and documented in the EPIC chart.   Directed ROS with pertinent positives and negatives documented in the history of present illness/assessment and plan.  Exam:  Vitals:   05/16/23 0942  BP: 120/78  Pulse: 74  Resp: 16   General appearance:  Normal  Abdomen: Normal  Gynecologic exam: Vulva normal.  Speculum:  Cervix normal.  IUD strings (x2) well seen.  Vagina normal.  Mild menstrual blood present.  No d/c.  Bimanual exam:  Uterus AV, normal volume, mobile, NT.  No adnexal mass, NT bilaterally.   Assessment/Plan:  42 y.o. [id number]   1. Menorrhagia with regular cycle Was well on ParaGard IUD x 02/2021, but last menstrual period was heavier, lasting longer than normal & unable to feel both iud strings, heavy cramping, extreme bloating.  LMP 05/13/23.  Currently normal flow.  Declines STI screen.  Normal gyn exam.  IUD in  good position.  Patient reassured.  Offered to start a Progestin only BCP, but prefers to observe at this time.  Declines Prolactin/TSH/CBC.  2. Dysmenorrhea Ibuprofen recommended as needed.  3. Encounter for routine checking of intrauterine contraceptive device (IUD)  ParaGard IUD in good location, reassured.  Genia Del MD, 9:45 AM 05/16/2023

## 2023-05-20 ENCOUNTER — Encounter: Payer: Self-pay | Admitting: Obstetrics & Gynecology

## 2023-05-20 DIAGNOSIS — N920 Excessive and frequent menstruation with regular cycle: Secondary | ICD-10-CM

## 2023-05-20 DIAGNOSIS — Z113 Encounter for screening for infections with a predominantly sexual mode of transmission: Secondary | ICD-10-CM

## 2023-05-27 NOTE — Telephone Encounter (Signed)
Per ML: "Prolactin, TSH, CBC.  HIV, RPR, Hep C, HBsAg."   Orders placed. Will fax to preferred location.

## 2023-06-03 LAB — CBC
Hematocrit: 41.6 % (ref 34.0–46.6)
Hemoglobin: 13.8 g/dL (ref 11.1–15.9)
MCH: 30.1 pg (ref 26.6–33.0)
MCHC: 33.2 g/dL (ref 31.5–35.7)
MCV: 91 fL (ref 79–97)
Platelets: 257 10*3/uL (ref 150–450)
RBC: 4.59 x10E6/uL (ref 3.77–5.28)
RDW: 11.9 % (ref 11.7–15.4)
WBC: 6.4 10*3/uL (ref 3.4–10.8)

## 2023-06-03 LAB — PROLACTIN: Prolactin: 4 ng/mL — ABNORMAL LOW (ref 4.8–33.4)

## 2023-06-03 LAB — TSH: TSH: 1.26 u[IU]/mL (ref 0.450–4.500)

## 2023-08-18 NOTE — Telephone Encounter (Signed)
Yes, please see which she would prefer. Happy to order them at annual visit if she wants to wait.

## 2023-08-18 NOTE — Telephone Encounter (Signed)
FYI. It has come to my attention that this pt's lab work was not completed.   Former ML pt.  All of the orders were faxed to the [facility] location on 05/27/2023. Pt's blood was drawn on 06/02/2023 and it was resulted on 06/03/2023.   However, the STI screen was not completed.   AEX due early 10/2023.  Ok to contact pt to see if she still desires to have done or wait until AEX to discuss if desires at that time? Please advise.

## 2023-09-03 NOTE — Telephone Encounter (Signed)
Mychart msg returned unread. AEX due after 10/30/2023, not yet scheduled, recall sent per EMR.   Spoke to pt and she wishes to decline STI screening at this time (orders cancelled). However, scheduled her AEX for 11/04/2023 @ 1130 w/ JC. Routing to provider(s) for final review.

## 2023-11-04 ENCOUNTER — Encounter: Payer: Self-pay | Admitting: Radiology

## 2023-11-04 ENCOUNTER — Ambulatory Visit (INDEPENDENT_AMBULATORY_CARE_PROVIDER_SITE_OTHER): Payer: Managed Care, Other (non HMO) | Admitting: Radiology

## 2023-11-04 VITALS — BP 136/84 | Ht 66.5 in | Wt 162.0 lb

## 2023-11-04 DIAGNOSIS — Z01419 Encounter for gynecological examination (general) (routine) without abnormal findings: Secondary | ICD-10-CM | POA: Diagnosis not present

## 2023-11-04 DIAGNOSIS — N921 Excessive and frequent menstruation with irregular cycle: Secondary | ICD-10-CM | POA: Diagnosis not present

## 2023-11-04 MED ORDER — PROGESTERONE MICRONIZED 100 MG PO CAPS
100.0000 mg | ORAL_CAPSULE | Freq: Every evening | ORAL | 4 refills | Status: AC
Start: 2023-11-04 — End: ?

## 2023-11-04 NOTE — Progress Notes (Signed)
Erica Estes June 08, 1981 [id number]   History:  42 y.o. G5P3 presents for annual exam. Had paragard for [facility]. Irregular menses, periods start with spotting for 3-4 days before cycle starts. Trouble sleeping Hx of low progesterone in pregnancies.   Gynecologic History Patient's last menstrual period was 10/12/2023 (exact date). Period Cycle (Days): 28 Period Duration (Days): 6 Period Pattern: Regular Menstrual Flow: Light, Moderate Menstrual Control: Tampon, Maxi pad, Thin pad Dysmenorrhea: (!) Mild Dysmenorrhea Symptoms: Cramping Contraception/Family planning: IUD Sexually active: yes Last Pap: 2023. Results were: normal   Obstetric History OB History  Gravida Para Term Preterm AB Living  5 3 3  0 2 3  SAB IAB Ectopic Multiple Live Births  0 0 1 0 3    # Outcome Date GA Lbr Len/2nd Weight Sex Type Anes PTL Lv  5 Term 01/23/21 [redacted]w[redacted]d 08:05 / 00:10 7 lb 13.4 oz (3.555 kg) M Vag-Spont None  LIV  4 Molar 10/20/19          3 Term 04/04/15 [redacted]w[redacted]d 15:05 / 00:25 7 lb 15.7 oz (3.62 kg) M Vag-Spont EPI  LIV  2 Term 12/29/12 [redacted]w[redacted]d 23:16 / 00:43 6 lb 15 oz (3.147 kg) M Vag-Spont EPI  LIV  1 Ectopic 2011            The following portions of the patient's history were reviewed and updated as appropriate: allergies, current medications, past family history, past medical history, past social history, past surgical history, and problem list.  ROS  Past medical history, past surgical history, family history and social history were all reviewed and documented in the EPIC chart.  Exam:  Vitals:   11/04/23 1135  BP: 136/84  Weight: 162 lb (73.5 kg)  Height: 5' 6.5" (1.689 m)   Body mass index is 25.76 kg/m.  Physical Exam Vitals and nursing note reviewed. Exam conducted with a chaperone present.  Constitutional:      Appearance: Normal appearance. She is normal weight.  HENT:     Head: Normocephalic and atraumatic.  Neck:     Thyroid: No thyroid mass, thyromegaly or thyroid  tenderness.  Cardiovascular:     Rate and Rhythm: Regular rhythm.     Heart sounds: Normal heart sounds.  Pulmonary:     Effort: Pulmonary effort is normal.     Breath sounds: Normal breath sounds.  Chest:  Breasts:    Breasts are symmetrical.     Right: Normal. No inverted nipple, mass, nipple discharge, skin change or tenderness.     Left: Normal. No inverted nipple, mass, nipple discharge, skin change or tenderness.  Abdominal:     General: Abdomen is flat. Bowel sounds are normal.     Palpations: Abdomen is soft.  Genitourinary:    General: Normal vulva.     Vagina: Normal. No vaginal discharge, bleeding or lesions.     Cervix: Normal. No discharge or lesion.     Uterus: Normal. Not enlarged and not tender.      Adnexa: Right adnexa normal and left adnexa normal.       Right: No mass, tenderness or fullness.         Left: No mass, tenderness or fullness.       Comments: IUD strings seen 2cm from os Lymphadenopathy:     Upper Body:     Right upper body: No axillary adenopathy.     Left upper body: No axillary adenopathy.  Skin:    General: Skin is warm and dry.  Neurological:  Mental Status: She is alert and oriented to person, place, and time.  Psychiatric:        Mood and Affect: Mood normal.        Thought Content: Thought content normal.        Judgment: Judgment normal.      Raynelle Fanning, CMA present for exam  Assessment/Plan:   1. Well woman exam with routine gynecological exam Pap up to date Schedule mammogram Labs with PCP  2. Irregular intermenstrual bleeding Will try cyclic progesterone to help with irregular cycles and sleep - progesterone (PROMETRIUM) 100 MG capsule; Take 1 capsule (100 mg total) by mouth at bedtime. For the first 12 days of cycle  Dispense: 36 capsule; Refill: 4    Discussed SBE, pap and mammogram screening as directed/appropriate. Recommend of exercise weekly, including weight bearing exercise. Encouraged the use of  seatbelts and sunscreen.  Return in about 1 year (around 11/03/2024) for Annual.  Tanda Rockers WHNP-BC 11:58 AM 11/04/2023

## 2023-12-01 NOTE — Telephone Encounter (Signed)
Left message to call [facility] Triage at [phone], option 4.    Per review of [facility]EX notes dated 11/04/23, Will try cyclic progesterone to help with irregular cycles and sleep - progesterone (PROMETRIUM) 100 MG capsule; Take 1 capsule (100 mg total) by mouth at bedtime. For the first 12 days of cycle.

## 2023-12-09 NOTE — Telephone Encounter (Signed)
I would recommend she take it continuously, may see better benefit. Continue with the 100mg  for 1 month, if no difference will increase to 200mg .

## 2024-01-09 NOTE — Telephone Encounter (Signed)
 LDVM on pt's machine in f/u to her mychart msg, advised her to cb and inform us that she received and/or msg [place] back via mychart.

## 2024-11-17 ENCOUNTER — Other Ambulatory Visit: Payer: Self-pay | Admitting: Nurse Practitioner

## 2024-11-17 DIAGNOSIS — Z1231 Encounter for screening mammogram for malignant neoplasm of breast: Secondary | ICD-10-CM

## 2024-12-01 ENCOUNTER — Ambulatory Visit
Admission: RE | Admit: 2024-12-01 | Discharge: 2024-12-01 | Disposition: A | Source: Ambulatory Visit | Attending: Nurse Practitioner | Admitting: Nurse Practitioner

## 2024-12-01 DIAGNOSIS — Z1231 Encounter for screening mammogram for malignant neoplasm of breast: Secondary | ICD-10-CM | POA: Insufficient documentation

## 2025-01-19 ENCOUNTER — Ambulatory Visit: Admitting: Radiology

## 2025-01-26 ENCOUNTER — Ambulatory Visit: Payer: Self-pay | Admitting: Radiology

## 2025-01-26 ENCOUNTER — Other Ambulatory Visit (HOSPITAL_COMMUNITY)
Admission: RE | Admit: 2025-01-26 | Discharge: 2025-01-26 | Disposition: A | Source: Ambulatory Visit | Attending: Radiology | Admitting: Radiology

## 2025-01-26 ENCOUNTER — Encounter: Payer: Self-pay | Admitting: Radiology

## 2025-01-26 VITALS — BP 130/84 | HR 91 | Ht 66.5 in | Wt 156.0 lb

## 2025-01-26 DIAGNOSIS — Z01419 Encounter for gynecological examination (general) (routine) without abnormal findings: Secondary | ICD-10-CM | POA: Diagnosis not present

## 2025-01-26 DIAGNOSIS — Z1331 Encounter for screening for depression: Secondary | ICD-10-CM

## 2025-01-26 DIAGNOSIS — Z975 Presence of (intrauterine) contraceptive device: Secondary | ICD-10-CM

## 2025-01-26 NOTE — Patient Instructions (Signed)
 Preventive Care 42-42 Years Old, Female  Preventive care refers to lifestyle choices and visits with your health care provider that can promote health and wellness. Preventive care visits are also called wellness exams.  What can I expect for my preventive care visit?  Counseling  Your health care provider may ask you questions about your:  Medical history, including:  Past medical problems.  Family medical history.  Pregnancy history.  Current health, including:  Menstrual cycle.  Method of birth control.  Emotional well-being.  Home life and relationship well-being.  Sexual activity and sexual health.  Lifestyle, including:  Alcohol, nicotine or tobacco, and drug use.  Access to firearms.  Diet, exercise, and sleep habits.  Work and work Astronomer.  Sunscreen use.  Safety issues such as seatbelt and bike helmet use.  Physical exam  Your health care provider will check your:  Height and weight. These may be used to calculate your BMI (body mass index). BMI is a measurement that tells if you are at a healthy weight.  Waist circumference. This measures the distance around your waistline. This measurement also tells if you are at a healthy weight and may help predict your risk of certain diseases, such as type 2 diabetes and high blood pressure.  Heart rate and blood pressure.  Body temperature.  Skin for abnormal spots.  What immunizations do I need?    Vaccines are usually given at various ages, according to a schedule. Your health care provider will recommend vaccines for you based on your age, medical history, and lifestyle or other factors, such as travel or where you work.  What tests do I need?  Screening  Your health care provider may recommend screening tests for certain conditions. This may include:  Lipid and cholesterol levels.  Diabetes screening. This is done by checking your blood sugar (glucose) after you have not eaten for a while (fasting).  Pelvic exam and Pap test.  Hepatitis B test.  Hepatitis C  test.  HIV (human immunodeficiency virus) test.  STI (sexually transmitted infection) testing, if you are at risk.  Lung cancer screening.  Colorectal cancer screening.  Mammogram. Talk with your health care provider about when you should start having regular mammograms. This may depend on whether you have a family history of breast cancer.  BRCA-related cancer screening. This may be done if you have a family history of breast, ovarian, tubal, or peritoneal cancers.  Bone density scan. This is done to screen for osteoporosis.  Talk with your health care provider about your test results, treatment options, and if necessary, the need for more tests.  Follow these instructions at home:  Eating and drinking    Eat a diet that includes fresh fruits and vegetables, whole grains, lean protein, and low-fat dairy products.  Take vitamin and mineral supplements as recommended by your health care provider.  Do not drink alcohol if:  Your health care provider tells you not to drink.  You are pregnant, may be pregnant, or are planning to become pregnant.  If you drink alcohol:  Limit how much you have to 0-1 drink a day.  Know how much alcohol is in your drink. In the [place] one drink equals one 12 oz bottle of beer (355 mL), one 5 oz glass of wine (148 mL), or one 1 oz glass of hard liquor (44 mL).  Lifestyle  Brush your teeth every morning and night with fluoride toothpaste. Floss one time each day.  Exercise for at least  30 minutes 5 or more days each week.  Do not use any products that contain nicotine or tobacco. These products include cigarettes, chewing tobacco, and vaping devices, such as e-cigarettes. If you need help quitting, ask your health care provider.  Do not use drugs.  If you are sexually active, practice safe sex. Use a condom or other form of protection to prevent STIs.  If you do not wish to become pregnant, use a form of birth control. If you plan to become pregnant, see your health care provider for a  prepregnancy visit.  Take aspirin only as told by your health care provider. Make sure that you understand how much to take and what form to take. Work with your health care provider to find out whether it is safe and beneficial for you to take aspirin daily.  Find healthy ways to manage stress, such as:  Meditation, yoga, or listening to music.  Journaling.  Talking to a trusted person.  Spending time with friends and family.  Minimize exposure to UV radiation to reduce your risk of skin cancer.  Safety  Always wear your seat belt while driving or riding in a vehicle.  Do not drive:  If you have been drinking alcohol. Do not ride with someone who has been drinking.  When you are tired or distracted.  While texting.  If you have been using any mind-altering substances or drugs.  Wear a helmet and other protective equipment during sports activities.  If you have firearms in your house, make sure you follow all gun safety procedures.  Seek help if you have been physically or sexually abused.  What's next?  Visit your health care provider once a year for an annual wellness visit.  Ask your health care provider how often you should have your eyes and teeth checked.  Stay up to date on all vaccines.  This information is not intended to replace advice given to you by your health care provider. Make sure you discuss any questions you have with your health care provider.  Document Revised: 06/13/2021 Document Reviewed: 06/13/2021  Elsevier Patient Education  2024 [employer].

## 2025-01-26 NOTE — Progress Notes (Signed)
 "  Erica Estes 02/10/1981 [id number]   History:  44 y.o. G5P3 presents for annual exam. Paragard for [facility] since 3/22. PMS sx controlled with supplementing B and C vitamins. C/o brain fog. Otherwise no gyn concerns.  Gynecologic History Patient's last menstrual period was 01/14/2025 (exact date). Period Cycle (Days): 25 Period Duration (Days): 8 Period Pattern: Regular Menstrual Flow: Light, Moderate Menstrual Control: Thin pad, Maxi pad, Tampon Dysmenorrhea: None Contraception/Family planning: IUD Sexually active: yes Last Pap: 10/23. Results were: normal Last mammogram: 12/25. Results were: normal  Obstetric History OB History  Gravida Para Term Preterm AB Living  5 3 3  0 2 3  SAB IAB Ectopic Multiple Live Births  0 0 1 0 3    # Outcome Date GA Lbr Len/2nd Weight Sex Type Anes PTL Lv  5 Term 01/23/21 [redacted]w[redacted]d 08:05 / 00:10 7 lb 13.4 oz (3.555 kg) M Vag-Spont None  LIV  4 Molar 10/20/19          3 Term 04/04/15 [redacted]w[redacted]d 15:05 / 00:25 7 lb 15.7 oz (3.62 kg) M Vag-Spont EPI  LIV  2 Term 12/29/12 [redacted]w[redacted]d 23:16 / 00:43 6 lb 15 oz (3.147 kg) M Vag-Spont EPI  LIV  1 Ectopic 2011               01/26/2025   11:36 AM  Depression screen PHQ 2/9  Decreased Interest 0  Down, Depressed, Hopeless 0  PHQ - 2 Score 0     The following portions of the patient's history were reviewed and updated as appropriate: allergies, current medications, past family history, past medical history, past social history, past surgical history, and problem list.  Review of Systems  Constitutional: Negative.   Cardiovascular: Negative.   Gastrointestinal:  Negative for abdominal pain, blood in stool, constipation, nausea and vomiting.  Genitourinary: Negative.  Negative for dysuria, flank pain, frequency, hematuria and urgency.  Endo/Heme/Allergies:  Does not bruise/bleed easily.  Psychiatric/Behavioral: Negative.  Negative for depression, memory loss and substance abuse. The patient is not nervous/anxious and  does not have insomnia.     Past medical history, past surgical history, family history and social history were all reviewed and documented in the EPIC chart.  Exam:  Vitals:   01/26/25 1135  BP: 130/84  Pulse: 91  SpO2: 98%  Weight: 156 lb (70.8 kg)  Height: 5' 6.5 (1.689 m)   Body mass index is 24.8 kg/m.  Physical Exam Vitals and nursing note reviewed. Exam conducted with a chaperone present.  Constitutional:      Appearance: Normal appearance. She is normal weight.  HENT:     Head: Normocephalic and atraumatic.  Neck:     Thyroid: No thyroid mass, thyromegaly or thyroid tenderness.  Cardiovascular:     Rate and Rhythm: Regular rhythm.     Heart sounds: Normal heart sounds.  Pulmonary:     Effort: Pulmonary effort is normal.     Breath sounds: Normal breath sounds.  Chest:  Breasts:    Breasts are symmetrical.     Right: Normal. No inverted nipple, mass, nipple discharge, skin change or tenderness.     Left: Normal. No inverted nipple, mass, nipple discharge, skin change or tenderness.  Abdominal:     General: Abdomen is flat. Bowel sounds are normal.     Palpations: Abdomen is soft.  Genitourinary:    General: Normal vulva.     Vagina: Normal. No vaginal discharge, bleeding or lesions.     Cervix: Normal. No discharge  or lesion.     Uterus: Normal. Not enlarged and not tender.      Adnexa: Right adnexa normal and left adnexa normal.       Right: No mass, tenderness or fullness.         Left: No mass, tenderness or fullness.       Comments: IUD strings seen Lymphadenopathy:     Upper Body:     Right upper body: No axillary adenopathy.     Left upper body: No axillary adenopathy.  Skin:    General: Skin is warm and dry.  Neurological:     Mental Status: She is alert and oriented to person, place, and time.  Psychiatric:        Mood and Affect: Mood normal.        Thought Content: Thought content normal.        Judgment: Judgment normal.      Erica Estes, CMA present for exam  Assessment/Plan:   1. Well woman exam with routine gynecological exam (Primary) - Cytology - PAP( [place]) - Yearly mammo - Labs with PCP  2. IUD (intrauterine device) in place Paragard may remain until 3/32  3. Depression screening negative   May try Neuro Mag for brain fog  Return in about 1 year (around 01/26/2026) for Annual.  Erica Estes WHNP-BC 11:57 AM 01/26/2025 "

## 2025-01-27 ENCOUNTER — Ambulatory Visit: Payer: Self-pay | Admitting: Radiology

## 2025-01-27 LAB — CYTOLOGY - PAP
Comment: NEGATIVE
Diagnosis: NEGATIVE
High risk HPV: NEGATIVE
